# Patient Record
Sex: Female | Born: 1970 | Race: White | Hispanic: Yes | Marital: Married | Smoking: Never smoker
Health system: Southern US, Community
[De-identification: ages and names within clinical notes are randomized; demographics above are authoritative.]

## PROBLEM LIST (undated history)

## (undated) HISTORY — PX: CHOLECYSTECTOMY: SHX55

## (undated) HISTORY — PX: SHOULDER SURGERY: SHX246

---

## 2001-09-09 ENCOUNTER — Ambulatory Visit (HOSPITAL_COMMUNITY): Admission: RE | Admit: 2001-09-09 | Discharge: 2001-09-09 | Payer: Self-pay | Admitting: *Deleted

## 2002-01-09 ENCOUNTER — Inpatient Hospital Stay (HOSPITAL_COMMUNITY): Admission: AD | Admit: 2002-01-09 | Discharge: 2002-01-11 | Payer: Self-pay | Admitting: *Deleted

## 2002-04-03 ENCOUNTER — Encounter: Payer: Self-pay | Admitting: Emergency Medicine

## 2002-04-03 ENCOUNTER — Emergency Department (HOSPITAL_COMMUNITY): Admission: EM | Admit: 2002-04-03 | Discharge: 2002-04-03 | Payer: Self-pay | Admitting: Emergency Medicine

## 2003-05-15 ENCOUNTER — Ambulatory Visit (HOSPITAL_COMMUNITY): Admission: RE | Admit: 2003-05-15 | Discharge: 2003-05-15 | Payer: Self-pay | Admitting: *Deleted

## 2003-07-25 ENCOUNTER — Encounter: Admission: RE | Admit: 2003-07-25 | Discharge: 2003-07-25 | Payer: Self-pay | Admitting: *Deleted

## 2003-07-30 ENCOUNTER — Inpatient Hospital Stay (HOSPITAL_COMMUNITY): Admission: AD | Admit: 2003-07-30 | Discharge: 2003-07-31 | Payer: Self-pay | Admitting: Obstetrics and Gynecology

## 2006-04-13 ENCOUNTER — Emergency Department (HOSPITAL_COMMUNITY): Admission: EM | Admit: 2006-04-13 | Discharge: 2006-04-13 | Payer: Self-pay | Admitting: Emergency Medicine

## 2006-04-16 ENCOUNTER — Ambulatory Visit: Payer: Self-pay | Admitting: Internal Medicine

## 2007-07-04 ENCOUNTER — Emergency Department (HOSPITAL_COMMUNITY): Admission: EM | Admit: 2007-07-04 | Discharge: 2007-07-04 | Payer: Self-pay | Admitting: Emergency Medicine

## 2008-05-14 ENCOUNTER — Emergency Department (HOSPITAL_COMMUNITY): Admission: EM | Admit: 2008-05-14 | Discharge: 2008-05-14 | Payer: Self-pay | Admitting: Emergency Medicine

## 2008-08-02 ENCOUNTER — Ambulatory Visit: Payer: Self-pay | Admitting: Internal Medicine

## 2008-08-03 ENCOUNTER — Ambulatory Visit: Payer: Self-pay | Admitting: *Deleted

## 2008-10-18 ENCOUNTER — Ambulatory Visit: Payer: Self-pay | Admitting: Internal Medicine

## 2008-11-26 ENCOUNTER — Ambulatory Visit: Payer: Self-pay | Admitting: Internal Medicine

## 2008-11-29 ENCOUNTER — Ambulatory Visit (HOSPITAL_COMMUNITY): Admission: RE | Admit: 2008-11-29 | Discharge: 2008-11-29 | Payer: Self-pay | Admitting: Internal Medicine

## 2009-06-20 ENCOUNTER — Ambulatory Visit: Payer: Self-pay | Admitting: Internal Medicine

## 2010-05-16 ENCOUNTER — Emergency Department (HOSPITAL_COMMUNITY)
Admission: EM | Admit: 2010-05-16 | Discharge: 2010-05-17 | Payer: Self-pay | Source: Home / Self Care | Admitting: Emergency Medicine

## 2010-05-18 ENCOUNTER — Encounter: Payer: Self-pay | Admitting: Internal Medicine

## 2010-05-19 LAB — CBC
MCH: 30.7 pg (ref 26.0–34.0)
MCV: 86.7 fL (ref 78.0–100.0)
Platelets: 218 10*3/uL (ref 150–400)
RDW: 12.1 % (ref 11.5–15.5)
WBC: 6.4 10*3/uL (ref 4.0–10.5)

## 2010-05-19 LAB — DIFFERENTIAL
Basophils Absolute: 0 10*3/uL (ref 0.0–0.1)
Eosinophils Relative: 5 % (ref 0–5)
Lymphocytes Relative: 35 % (ref 12–46)
Lymphs Abs: 2.3 10*3/uL (ref 0.7–4.0)
Neutro Abs: 3.2 10*3/uL (ref 1.7–7.7)

## 2010-05-19 LAB — PREGNANCY, URINE: Preg Test, Ur: NEGATIVE

## 2010-05-19 LAB — URINALYSIS, ROUTINE W REFLEX MICROSCOPIC
Bilirubin Urine: NEGATIVE
Hgb urine dipstick: NEGATIVE
Ketones, ur: NEGATIVE mg/dL
Protein, ur: NEGATIVE mg/dL
Urine Glucose, Fasting: NEGATIVE mg/dL

## 2010-05-19 LAB — COMPREHENSIVE METABOLIC PANEL
AST: 21 U/L (ref 0–37)
Albumin: 3.7 g/dL (ref 3.5–5.2)
Alkaline Phosphatase: 54 U/L (ref 39–117)
BUN: 16 mg/dL (ref 6–23)
CO2: 24 mEq/L (ref 19–32)
Glucose, Bld: 107 mg/dL — ABNORMAL HIGH (ref 70–99)
Sodium: 141 mEq/L (ref 135–145)
Total Protein: 7.2 g/dL (ref 6.0–8.3)

## 2010-08-11 LAB — DIFFERENTIAL
Eosinophils Absolute: 0.3 10*3/uL (ref 0.0–0.7)
Eosinophils Relative: 4 % (ref 0–5)
Lymphocytes Relative: 28 % (ref 12–46)
Lymphs Abs: 2 10*3/uL (ref 0.7–4.0)
Monocytes Absolute: 0.6 10*3/uL (ref 0.1–1.0)
Neutrophils Relative %: 60 % (ref 43–77)

## 2010-08-11 LAB — POCT I-STAT, CHEM 8
BUN: 11 mg/dL (ref 6–23)
Chloride: 108 mEq/L (ref 96–112)
Creatinine, Ser: 0.7 mg/dL (ref 0.4–1.2)
Glucose, Bld: 80 mg/dL (ref 70–99)
Potassium: 3.8 mEq/L (ref 3.5–5.1)

## 2010-08-11 LAB — CBC
HCT: 40.4 % (ref 36.0–46.0)
Platelets: 227 10*3/uL (ref 150–400)
RDW: 12.8 % (ref 11.5–15.5)
WBC: 7.2 10*3/uL (ref 4.0–10.5)

## 2010-10-09 ENCOUNTER — Emergency Department (HOSPITAL_COMMUNITY): Payer: Medicaid Other

## 2010-10-09 ENCOUNTER — Inpatient Hospital Stay (HOSPITAL_COMMUNITY)
Admission: EM | Admit: 2010-10-09 | Discharge: 2010-10-11 | DRG: 419 | Disposition: A | Discharge status: Home / Self Care | Payer: Medicaid Other | Attending: Surgery | Admitting: Surgery

## 2010-10-09 DIAGNOSIS — J45909 Unspecified asthma, uncomplicated: Secondary | ICD-10-CM | POA: Diagnosis present

## 2010-10-09 DIAGNOSIS — K8010 Calculus of gallbladder with chronic cholecystitis without obstruction: Principal | ICD-10-CM | POA: Diagnosis present

## 2010-10-09 LAB — COMPREHENSIVE METABOLIC PANEL
Alkaline Phosphatase: 59 U/L (ref 39–117)
BUN: 16 mg/dL (ref 6–23)
CO2: 23 mEq/L (ref 19–32)
Chloride: 105 mEq/L (ref 96–112)
GFR calc Af Amer: >60 mL/min (ref >60)
Glucose, Bld: 81 mg/dL (ref 70–99)
Total Protein: 7.1 g/dL (ref 6.0–8.3)

## 2010-10-09 LAB — URINALYSIS, ROUTINE W REFLEX MICROSCOPIC
Bilirubin Urine: NEGATIVE
Glucose, UA: NEGATIVE mg/dL
Hgb urine dipstick: NEGATIVE
Ketones, ur: NEGATIVE mg/dL
Protein, ur: NEGATIVE mg/dL
pH: 6.5 (ref 5.0–8.0)

## 2010-10-09 LAB — DIFFERENTIAL
Basophils Absolute: 0 10*3/uL (ref 0.0–0.1)
Eosinophils Absolute: 0.6 10*3/uL (ref 0.0–0.7)
Monocytes Relative: 7 % (ref 3–12)
Neutro Abs: 3.8 10*3/uL (ref 1.7–7.7)
Neutrophils Relative %: 56 % (ref 43–77)

## 2010-10-09 LAB — CBC
MCV: 86.8 fL (ref 78.0–100.0)
Platelets: 209 10*3/uL (ref 150–400)
RBC: 4.61 MIL/uL (ref 3.87–5.11)
RDW: 12.2 % (ref 11.5–15.5)
WBC: 6.7 10*3/uL (ref 4.0–10.5)

## 2010-10-09 LAB — POCT PREGNANCY, URINE: Preg Test, Ur: NEGATIVE

## 2010-10-09 LAB — LIPASE, BLOOD: Lipase: 32 U/L (ref 11–59)

## 2010-10-10 ENCOUNTER — Inpatient Hospital Stay (HOSPITAL_COMMUNITY): Payer: Medicaid Other

## 2010-10-10 ENCOUNTER — Other Ambulatory Visit (INDEPENDENT_AMBULATORY_CARE_PROVIDER_SITE_OTHER): Payer: Self-pay | Admitting: Surgery

## 2010-10-11 LAB — COMPREHENSIVE METABOLIC PANEL
ALT: 26 U/L (ref 0–35)
Albumin: 3.2 g/dL — ABNORMAL LOW (ref 3.5–5.2)
Alkaline Phosphatase: 48 U/L (ref 39–117)
BUN: 4 mg/dL — ABNORMAL LOW (ref 6–23)
Potassium: 3.8 mEq/L (ref 3.5–5.1)
Sodium: 137 mEq/L (ref 135–145)
Total Protein: 6.7 g/dL (ref 6.0–8.3)

## 2010-10-11 LAB — CBC
MCHC: 33.3 g/dL (ref 30.0–36.0)
RDW: 12.5 % (ref 11.5–15.5)

## 2010-10-28 NOTE — Op Note (Signed)
  NAMEELLA, GOLOMB          ACCOUNT NO.:  [id number]  MEDICAL RECORD NO.:  [id number]  LOCATION:  1530                         FACILITY:  [facility]  PHYSICIAN:  Thornton Park. Daphine Deutscher, MD  DATE OF BIRTH:  1970/09/21  DATE OF PROCEDURE:  10/10/2010 DATE OF DISCHARGE:                              OPERATIVE REPORT   PREOPERATIVE DIAGNOSIS:  [age]-year-old Hispanic female with chronic cholecystitis, pain presented to the Emergency Room.  PROCEDURE:  Lap chole with intraoperative cholangiogram.  SURGEON:  Thornton Park. Daphine Deutscher, M.D.  ASSISTANT:  None.  ANESTHESIA:  General endotracheal.  DESCRIPTION OF PROCEDURE:  Mrs. Darius Fillingim had informed consent given to her as I brought her Spanish speaking gallbladder book from the office.  In addition, we had an interpreter, who assisted.  She was taken to room 11 and given general anesthesia.  The abdomen was prepped with PC max and draped sterilely.  I entered the abdomen through the umbilicus with Hassan technique without difficulty.  The abdomen was inflated and three trocars were placed in the upper abdomen using 5 mm up there.  The gallbladder was grasped and it was pulled up and it was between she had a cleft lateral lobe that was securing it.  I dissected out Calot's triangle, got a critical view, put a clip upon the gallbladder.  I incised the cystic duct and did a dynamic cholangiogram, which showed a fairly plump common duct, but no filling defects and prompt flow into the duodenum.  Cystic duct was then triple clipped and divided.  The cystic artery was triple clipped and divided.  There was a posterior branch, which was triple clipped and the gallbladder was removed from the gallbladder bed with hook electrocautery without entering it.  It was detached and placed in a 5 mm bag and brought out through the umbilicus and I did have to crush the stones to get it out.  I went back and looked to the gallbladder bed.  No  bleeding or bile leaks were noted.  Umbilical defect was repaired with figure-of-eight suture of 0 Vicryl under laparoscopic vision and everything looked good. The wounds were injected with Exparel and closed with 4-0 Vicryl, Benzoin Steri-Strips.  The patient tolerated the procedure well and was taken to recovery room in satisfactory addition.     Thornton Park Daphine Deutscher, MD    MBM/MEDQ  D:  10/10/2010  T:  10/10/2010  Job:  [id number]  Electronically Signed by Luretha Murphy MD on 10/28/2010 10:17:01 AM

## 2010-10-28 NOTE — H&P (Signed)
NAMETYQUISHA, SHARPS          ACCOUNT NO.:  [id number]  MEDICAL RECORD NO.:  [id number]  LOCATION:  1530                         FACILITY:  [facility]  PHYSICIAN:  Thornton Park. Daphine Deutscher, MD  DATE OF BIRTH:  [birth year]  DATE OF ADMISSION:  10/09/2010 DATE OF DISCHARGE:                             HISTORY & PHYSICAL   PRIMARY CARE PHYSICIAN:  None  ADMITTING SURGEON:  Clarrissa Shimkus B. Daphine Deutscher, M.D.  TIME OF ADMISSION:  1400 p.m.  CHIEF COMPLAINT:  Abdominal pain and asthma.  HISTORY OF PRESENT ILLNESS:  Ms. Kiehn is a very pleasant 40 year old Hispanic female with a history of asthma as well as gallstones.  The patient states that she had an asthma attack yesterday.  She has not had any asthma attack since she was a little girl.  She does not have any medications at home for her asthma, says that it has been so long she had an attack.  Right after this attack, she also began having epigastric abdominal pain.  She states this pain was very similar to the pain she had earlier in the year when she was diagnosed with gallstones. She then subsequently developed nausea and vomiting and ultimately anorexia.  Her pain persisted in the epigastric area and presented to the Emergency Department today for further evaluation.  Upon arrival today, she did have another ultrasound, which revealed multiple gallstones with no evidence of cholecystitis.  All of her labs were normal, but her pain persisted.  Therefore, we were called to evaluate the patient for surgical admission.  REVIEW OF SYSTEMS:  Please see HPI, otherwise all other systems have been reviewed and are negative.  FAMILY HISTORY:  Noncontributory.  PAST MEDICAL HISTORY: 1. Asthma. 2. Cholelithiasis.  PAST SURGICAL HISTORY:  None.  SOCIAL HISTORY:  The patient is married with four children.  She is a stay-at-home mom.  She denies any alcohol, tobacco or illicit drug abuse.  ALLERGIES:  NKDA.  MEDICATIONS AT HOME:   None.  PHYSICAL EXAMINATION:  GENERAL:  Ms. Rister is a very pleasant 40- year-old slightly overweight Hispanic female, who is currently lying in bed, mildly tearful, but in no obvious distress. VITAL SIGNS:  Temperature 97.6, pulse 78, respirations 20, blood pressure 126/74. HEENT:  Head is normocephalic, atraumatic.  Sclerae noninjected.  Pupils are equal, round and reactive to light.  Ears and nose without any obvious masses or lesions.  No rhinorrhea.  Nares are pink.  Throat shows no exudate. HEART:  Regular rate and rhythm.  Normal S1, S2.  No murmurs, gallops or rubs are noted.  She does have palpable carotid, radial and pedal pulses bilaterally. LUNGS:  Reveals diffuse bilateral wheezing, but no rhonchi or rales are noted.  Respiratory effort is nonlabored. ABDOMEN:  Soft.  Mild epigastric tenderness, but otherwise nontender. She is nondistended with hypoactive bowel sounds.  No masses, hernias or organomegaly are noted. SKIN:  Warm and dry with no mass, lesions or rashes. PSYCHIATRIC:  The patient is alert and oriented x3 with an appropriate affect.  LABORATORY DATA:  White blood cell count 6700, hemoglobin 13.8, hematocrit 40, platelet count is 209,000.  Sodium 138, potassium 3.7, glucose 81, BUN 16, creatinine 0.53, lipase is 32.  LFTs are all unremarkable.  DIAGNOSTIC STUDIES:  Ultrasound of the abdomen and pelvis revealed multiple gallstones with no evidence of cholecystitis.  An acute abdominal series reveals no acute cardiopulmonary process.  IMPRESSION: 1. Symptomatic cholelithiasis. 2. Asthma.  PLAN:  At this time, we will get the patient admitted for symptomatic treatment.  We will plan for surgical intervention tomorrow for a cholecystectomy.  I have spoken to the patient, who only speaks Spanish via Nurse, learning disability.  I have explained the procedure including laparoscopic versus open along with risks and complications to her in Spanish.  She understands and  wishes to proceed.  In the meantime, we will start on p.r.n. medications as needed for pain and nausea as well as IV fluids. She will receive a dose of Unasyn 3 grams IV on-call to the operating room tomorrow morning.  We will also start her on p.r.n. nebulizers as well as an albuterol inhaler as needed for her asthma and shortness of breath.     Letha Cape, PA   ______________________________ Thornton Park Daphine Deutscher, MD    KEO/MEDQ  D:  10/09/2010  T:  10/09/2010  Job:  [id number]  Electronically Signed by Barnetta Chapel PA on 10/16/2010 01:24:57 PM Electronically Signed by Luretha Murphy MD on 10/28/2010 10:16:58 AM

## 2010-10-31 ENCOUNTER — Ambulatory Visit (INDEPENDENT_AMBULATORY_CARE_PROVIDER_SITE_OTHER): Payer: Self-pay | Admitting: Surgery

## 2010-10-31 VITALS — BP 112/68 | HR 64 | Temp 97.6°F

## 2010-10-31 DIAGNOSIS — Z09 Encounter for follow-up examination after completed treatment for conditions other than malignant neoplasm: Secondary | ICD-10-CM

## 2010-10-31 NOTE — Progress Notes (Signed)
With the aid of an interpreter I interviewed Ms. Bellow Reyes. She is 3 weeks out from a laparoscopic cholecystectomy. Hurst sutures and incisions are healed nicely with Dermabond in place. She has been eating well.  Yesterday she had a fever and some chills. She denies dysuria nausea or vomiting. She had a mild headache which is gone today.  She looks well and from the standpoint of her surgery she seems to be getting along very well. She has no rashes, no headaches, and she may have had a viral syndrome. I told her that if it persisted she should see emergency room for workup. Otherwise she has done well after laparoscopic cholecystectomy and we will be glad to see her when needed.

## 2010-11-13 NOTE — Discharge Summary (Signed)
  NAMEHARLYM, Sydney Lopez          ACCOUNT NO.:  [id number]  MEDICAL RECORD NO.:  [id number]  LOCATION:  1530                         FACILITY:  [facility]  PHYSICIAN:  Thornton Park. Daphine Deutscher, MD  DATE OF BIRTH:  02/10/1971  DATE OF ADMISSION:  10/09/2010 DATE OF DISCHARGE:  10/11/2010                              DISCHARGE SUMMARY   ADMITTING PHYSICIAN:  Dr. Luretha Murphy  DISCHARGING PHYSICIAN:  Dr. Luretha Murphy.  CONSULTANTS:  None.  PROCEDURES:  Laparoscopic cholecystectomy with intraoperative cholangiogram by Dr. Daphine Deutscher on October 10, 2010.  REASON FOR ADMISSION:  Ms. Ordaz is a 40 year old Hispanic female who has a history of asthma as well as gallstones.  The patient had an asthma attack the day prior to admission.  She then developed epigastric abdominal pain.  She developed nausea and vomiting.  When she presented to the emergency department, she was diagnosed with gallstones.  Please see admitting history and physical for further details.  ADMITTING DIAGNOSES: 1. Symptomatic cholelithiasis. 2. Asthma.  HOSPITAL COURSE:  This time the patient was admitted.  She was taken to the operating room where she underwent a laparoscopic cholecystectomy. She tolerated this procedure well and on postoperative day #1 the patient was doing well with minimal pain.  She was tolerating a regular diet.  Her abdomen was soft, appropriately tender with active bowel sounds.  Her incisions were clean, dry and intact.  She was felt stable for discharge home.  DISCHARGE DIAGNOSES: 1. Symptomatic cholelithiasis. 2. Status post lap chole. 3. Asthma.  DISCHARGE MEDICATIONS:  Please see medication reconciliation form.  DISCHARGE INSTRUCTIONS:  The patient needs to return to Dr. Daphine Deutscher in 3 weeks.  She has no activity restrictions.  No diet restrictions, and she may shower.     Letha Cape, PA   ______________________________ Thornton Park Daphine Deutscher, MD    KEO/MEDQ  D:   10/23/2010  T:  10/23/2010  Job:  [id number]  Electronically Signed by Barnetta Chapel PA on 10/31/2010 04:19:02 PM Electronically Signed by Luretha Murphy MD on 11/13/2010 08:46:13 AM

## 2010-11-27 ENCOUNTER — Inpatient Hospital Stay (HOSPITAL_COMMUNITY)
Admission: EM | Admit: 2010-11-27 | Discharge: 2010-11-30 | DRG: 203 | Disposition: A | Discharge status: Home / Self Care | Payer: Self-pay | Attending: Internal Medicine | Admitting: Internal Medicine

## 2010-11-27 ENCOUNTER — Emergency Department (HOSPITAL_COMMUNITY): Payer: Self-pay

## 2010-11-27 DIAGNOSIS — M25519 Pain in unspecified shoulder: Secondary | ICD-10-CM | POA: Diagnosis present

## 2010-11-27 DIAGNOSIS — IMO0001 Reserved for inherently not codable concepts without codable children: Secondary | ICD-10-CM | POA: Diagnosis present

## 2010-11-27 DIAGNOSIS — J45901 Unspecified asthma with (acute) exacerbation: Principal | ICD-10-CM | POA: Diagnosis present

## 2010-11-27 DIAGNOSIS — R7309 Other abnormal glucose: Secondary | ICD-10-CM | POA: Diagnosis present

## 2010-11-27 LAB — POCT I-STAT, CHEM 8
BUN: 17 mg/dL (ref 6–23)
Chloride: 107 mEq/L (ref 96–112)
Creatinine, Ser: 0.6 mg/dL (ref 0.50–1.10)
Sodium: 140 mEq/L (ref 135–145)
TCO2: 22 mmol/L (ref 0–100)

## 2010-11-28 LAB — BASIC METABOLIC PANEL
BUN: 11 mg/dL (ref 6–23)
CO2: 22 mEq/L (ref 19–32)
Calcium: 9.5 mg/dL (ref 8.4–10.5)
Creatinine, Ser: 0.61 mg/dL (ref 0.50–1.10)
Glucose, Bld: 158 mg/dL — ABNORMAL HIGH (ref 70–99)

## 2010-11-28 LAB — CBC
HCT: 39.9 % (ref 36.0–46.0)
MCH: 29.8 pg (ref 26.0–34.0)
MCV: 87.5 fL (ref 78.0–100.0)
Platelets: 230 10*3/uL (ref 150–400)
RBC: 4.56 MIL/uL (ref 3.87–5.11)

## 2010-11-28 NOTE — H&P (Signed)
Sydney Lopez, Sydney Lopez          ACCOUNT NO.:  [id number]  MEDICAL RECORD NO.:  [id number]  LOCATION:  WLED                         FACILITY:  [facility]  PHYSICIAN:  Gery Pray, MD      DATE OF BIRTH:  [birth year]  DATE OF ADMISSION:  11/27/2010 DATE OF DISCHARGE:                             HISTORY & PHYSICAL   PRIMARY CARE PHYSICIAN:  None.  CODE STATUS:  Full code.  The patient is going to team 2.  CHIEF COMPLAINT:  Shortness of breath.  HISTORY OF PRESENT ILLNESS:  This is a pleasant 40 year old female with known history of asthma.  She states that, for the past 2 days, she has been having increasing shortness of breath and wheezing.  She has been having a cough that is productive of whitish phlegm.  She has developed chest pain, pleuritic in nature, worse with the cough.  She reports no fevers, no chills, no nausea and vomiting.  She does not smoke.  She has no sick contacts.  The last time she had asthma exacerbation was over a year ago.  She has never been intubated.  She states that she has been using her MDI and it has not been helping.  Her shortness of breath became severe enough she came to the ER.  She received 1-hour  long nebulizer treatment of Solu-Medrol.  She has improved; however, she still has significant wheezing and is quite short of breath.  A hospital service was called, which request admission.  Of note, the patient is primarily Spanish-speaking.  The history was obtained from the patient through a translator.  PAST MEDICAL HISTORY:  Negative.  PAST SURGICAL HISTORY:  The patient states she had a surgery 2 months ago.  She does not know what they took out.  MEDICATIONS:  MDI.  ALLERGIES:  None.  SOCIAL HISTORY:  Negative tobacco, alcohol, illicit drugs.  FAMILY HISTORY:  Negative for diabetes mellitus or hypertension.  REVIEW OF SYSTEMS:  All 10-point systems reviewed and negative except as noted in the HPI.  PHYSICAL EXAMINATION:  VITAL  SIGNS:  Blood pressure 136/92, pulse 106, respirations 22, temperature 98.2, saturating 95% on room air. GENERAL:  Alert, oriented female; in no acute distress. HEENT:  Eyes:  Pink conjunctivae.  PERRLA.   ENT:  Moist oral mucosa. Trachea midline. NECK:  Supple. no thyromegaly LUNGS:  Wheezy throughout.  No use of accessory muscles. ABDOMEN:  Soft.  Positive bowel sounds.  Nontender, nondistended.  No organomegaly. NEUROLOGIC:  Cranial nerves II-XII grossly intact.  Sensation intact. Musculoskeletal: 5/5 in all extremities.  No clubbing, cyanosis or edema. SKIN:  No rashes.  No subcutaneous crepitations.  LABORATORY DATA:  Sodium 140, potassium 4.3, chloride 107, BUN 17, creatinine 0.6, calcium 1.16.  Hemoglobin 13.5.  No white blood cells available.  These labs were done through the I-STAT.  Chest x-ray shows no acute cardiopulmonary disease.  EKG normal sinus rhythm.  ASSESSMENT AND PLAN:  Acute exacerbation of asthma.  The patient will be admitted.  We will go ahead and order Solu-Medrol and nebulizer treatment, oxygen as needed to keep saturations greater than 90%.  We will reevaluate her in the a.m.  ______________________________ Gery Pray, MD     DC/MEDQ  D:  11/27/2010  T:  11/28/2010  Job:  [id number]  Electronically Signed by Gery Pray MD on 11/28/2010 02:27:55 AM

## 2010-12-02 NOTE — Discharge Summary (Signed)
Sydney Lopez, Sydney Lopez          ACCOUNT NO.:  [id number]  MEDICAL RECORD NO.:  [id number]  LOCATION:  1315                         FACILITY:  [facility]  PHYSICIAN:  Hillery Aldo, M.D.   DATE OF BIRTH:  1971/03/26  DATE OF ADMISSION:  11/27/2010 DATE OF DISCHARGE:  11/30/2010                              DISCHARGE SUMMARY   PRIMARY CARE PHYSICIAN:  Dineen Kid. Reche Dixon, MD, at [facility].  DISCHARGE DIAGNOSES: 1. Acute asthma exacerbation. 2. Acute respiratory failure. 3. Myalgias. 4. Steroid-induced hyperglycemia.  DISCHARGE MEDICATIONS: 1. Robitussin DM 10 cc p.o. q.4 h. p.r.n. cough. 2. Prednisone taper 60 mg on December 01, 2010, tapered off over the next     6 days. 3. Tylenol 500 mg 2 tablets p.o. q.6 h. p.r.n. headache or pain. 4. Albuterol inhaler 1 puff q.6 h. p.r.n. dyspnea.  CONSULTATIONS:  None.  BRIEF ADMISSION HISTORY OF PRESENT ILLNESS:  The patient is a 40 year old female with past medical history of asthma who presented to the hospital with a 2-day history of increasing dyspnea and wheezing.  She also reported cough, productive of white phlegm.  She had been using her albuterol HFA and it had not been successful in alleviating her symptoms, so she presented to the emergency department where she was found to be in acute bronchospasm.  When she failed to recover significantly after 1 hour long nebulizer treatment and treatment with Solu-Medrol, she was referred to the hospitalist service for further evaluation and treatment.  For the full details, please see the dictated report done by Dr. Joneen Roach.  PROCEDURES AND DIAGNOSTIC STUDIES:  Chest x-ray on November 27, 2010, showed no active cardiopulmonary disease.  DISCHARGE LABORATORY VALUES:  Sodium was 137, potassium 3.6, chloride 105, bicarbonate 22, BUN 11, creatinine 0.61, glucose 158, calcium 9.5. White blood cell count was 6.9, hemoglobin 13.6, hematocrit 39.9, platelets 230.  HOSPITAL COURSE BY PROBLEM: 1.  Acute respiratory failure secondary to asthma exacerbation:  The     patient was admitted and put on high-dose IV steroids as well as     empiric treatment with azithromycin for possible bronchitis.  Her     cough was treated symptomatically and she was placed on nebulized     bronchodilator therapy.  Over the course of her hospital stay, her     symptoms gradually improved.  She is still having some wheezing,     but feels much better and is stable for discharge home.  We will     discharge her on a prednisone taper and p.r.n. bronchodilator     therapy as well as over-the-counter antitussives.  At this point,     she is maintaining her oxygen saturations reasonably well.  She is     encouraged to follow up at [facility] in 1-2 weeks. 2. Myalgias:  Symptomatic treatment was provided. 3. Steroid-induced hyperglycemia:  Expected to resolve once her     steroids are tapered off.  DISPOSITION:  The patient is medically stable and will be discharged home.  DISCHARGE INSTRUCTIONS:  Activity:  Increased activity slowly. Diet:  As tolerated, avoid milk products for 1 week. Followup:  With Dr. Daphine Deutscher or Reche Dixon at [facility] in 1-2 weeks.  Call for an  appointment.  Time spent coordinating care for discharge, discharge instructions including face-to-face time equals approximately 25 minutes.     Hillery Aldo, M.D.     CR/MEDQ  D:  11/30/2010  T:  11/30/2010  Job:  [id number]  cc:   [facility] Fax: [phone]  Electronically Signed by Hillery Aldo M.D. on 12/02/2010 07:12:30 AM

## 2011-08-27 ENCOUNTER — Other Ambulatory Visit: Payer: Self-pay | Admitting: Obstetrics and Gynecology

## 2011-08-27 DIAGNOSIS — Z1231 Encounter for screening mammogram for malignant neoplasm of breast: Secondary | ICD-10-CM

## 2011-09-01 ENCOUNTER — Ambulatory Visit (INDEPENDENT_AMBULATORY_CARE_PROVIDER_SITE_OTHER): Payer: Self-pay | Admitting: *Deleted

## 2011-09-01 ENCOUNTER — Ambulatory Visit (HOSPITAL_COMMUNITY)
Admission: RE | Admit: 2011-09-01 | Discharge: 2011-09-01 | Disposition: A | Discharge status: Home / Self Care | Payer: Self-pay | Source: Ambulatory Visit | Attending: Obstetrics and Gynecology | Admitting: Obstetrics and Gynecology

## 2011-09-01 VITALS — BP 107/74 | HR 75 | Temp 97.9°F | Ht 59.0 in | Wt 169.4 lb

## 2011-09-01 DIAGNOSIS — Z1231 Encounter for screening mammogram for malignant neoplasm of breast: Secondary | ICD-10-CM

## 2011-09-01 DIAGNOSIS — Z1239 Encounter for other screening for malignant neoplasm of breast: Secondary | ICD-10-CM

## 2011-09-01 NOTE — Progress Notes (Signed)
No complaints today.  Pap Smear:    Pap smear not performed today. Patients last Pap smear was 04/15/11 at the [facility] and was normal. Per patient she has no history of abnormal Pap smears. No Pap smear results in EPIC.  Physical exam: Breasts Breasts symmetrical. No skin abnormalities bilateral breasts. No nipple retraction bilateral breasts. No nipple discharge bilateral breasts. No lymphadenopathy. No lumps palpated bilateral breasts. No complaints of pain or tenderness on exam.         Pelvic/Bimanual No Pap smear completed today since last Pap smear was 04/15/11 and normal. Pap smear not indicated per BCCCP guidelines.

## 2011-09-01 NOTE — Patient Instructions (Signed)
Taught patient how to perform BSE and gave educational materials to take home. Patient did not need a Pap smear today due to last Pap smear was 04/15/11. Let her know BCCCP will cover Pap smears every 3 years unless has a history of abnormal Pap smears. Patient is escorted to mammography for a screening mammogram. Let patient know will follow up with her within the next couple weeks with results. Patient verbalized understanding.

## 2011-09-09 ENCOUNTER — Encounter: Payer: Self-pay | Admitting: Obstetrics and Gynecology

## 2013-11-28 ENCOUNTER — Other Ambulatory Visit: Payer: Self-pay | Admitting: Nurse Practitioner

## 2013-11-28 DIAGNOSIS — Z1231 Encounter for screening mammogram for malignant neoplasm of breast: Secondary | ICD-10-CM

## 2014-07-23 ENCOUNTER — Ambulatory Visit (INDEPENDENT_AMBULATORY_CARE_PROVIDER_SITE_OTHER): Payer: Self-pay | Admitting: Internal Medicine

## 2014-07-23 VITALS — BP 132/82 | HR 78 | Temp 98.3°F | Resp 16

## 2014-07-23 DIAGNOSIS — R062 Wheezing: Secondary | ICD-10-CM

## 2014-07-23 MED ORDER — PREDNISONE 20 MG PO TABS: ORAL_TABLET | ORAL | Status: DC

## 2014-07-23 MED ORDER — HYDROCODONE-HOMATROPINE 5-1.5 MG/5ML PO SYRP: 5.0000 mL | ORAL_SOLUTION | Freq: Four times a day (QID) | ORAL | Status: DC | PRN

## 2014-07-23 MED ORDER — ALBUTEROL SULFATE HFA 108 (90 BASE) MCG/ACT IN AERS: 2.0000 | INHALATION_SPRAY | Freq: Four times a day (QID) | RESPIRATORY_TRACT | Status: DC | PRN

## 2014-07-23 MED ORDER — AZITHROMYCIN 250 MG PO TABS: ORAL_TABLET | ORAL | Status: DC

## 2014-07-23 MED ORDER — METHYLPREDNISOLONE ACETATE 80 MG/ML IJ SUSP: 80.0000 mg | Freq: Once | INTRAMUSCULAR | Status: DC

## 2014-07-24 NOTE — Progress Notes (Signed)
First urgent medical and family care visit Chief Complaint  Patient presents with  . Shortness of Breath  . Cough  . Emesis  . Headache    She has had progressive trouble over the last 5 days with wheezing that causes her to be short of breath and then to cough to the point of throwing up. She has had an intermittent headache with the coughing spells. There is no underlying nausea. She has no fever. She has a history of asthma and uses albuterol occasionally but is never had to be on continuous medication. She was hospitalized or treated in the emergency room about a year ago for similar problem that was more severe. Her albuterol inhaler has now run out.   She has no other underlying medical problems and is on no medications   Exam BP 132/82 mmHg  Pulse 78  Temp(Src) 98.3 F (36.8 C) (Oral)  Resp 16  SpO2 97%  LMP 06/20/2014  Conjunctiva slightly injected but PERRLA and EOMs conjugate  TMs clear/ nares clear  Throat clear  No cervical adenopathy  Lungs clear except wheezing on forced expiration bilaterally  Abdomen supple  Heart regular without murmur  Extremities with no edema  Skin shows no obvious rash  She is oriented to time person and place and speaks English fairly well for us to get through this visit with the use of cell phone translation at certain times   Impression  Asthma exacerbation most probably by lower respiratory infection  Because of her experience with getting so much worse last year she asked to start treatment with an injection of steroids as a used in the hospital. Meds ordered this encounter  Medications  . methylPREDNISolone acetate (DEPO-MEDROL) injection 80 mg    Sig:   . azithromycin (ZITHROMAX) 250 MG tablet    Sig: As packaged    Dispense:  6 tablet    Refill:  0  . HYDROcodone-homatropine (HYCODAN) 5-1.5 MG/5ML syrup    Sig: Take 5 mLs by mouth every 6 (six) hours as needed. For cough    Dispense:  120 mL    Refill:  0  . predniSONE  (DELTASONE) 20 MG tablet    Sig: 3/3/2/2/1/1 single daily dose for 6 days starting Tuesday morning    Dispense:  12 tablet    Refill:  0  . albuterol (PROVENTIL HFA;VENTOLIN HFA) 108 (90 BASE) MCG/ACT inhaler    Sig: Inhale 2 puffs into the lungs every 6 (six) hours as needed for wheezing or shortness of breath.    Dispense:  1 Inhaler    Refill:  5    follow-up in 2-48 hours if not responding

## 2015-01-10 ENCOUNTER — Emergency Department (HOSPITAL_COMMUNITY): Payer: Self-pay

## 2015-01-10 ENCOUNTER — Emergency Department (HOSPITAL_COMMUNITY)
Admission: EM | Admit: 2015-01-10 | Discharge: 2015-01-10 | Disposition: A | Discharge status: Home / Self Care | Payer: Self-pay | Attending: Emergency Medicine | Admitting: Emergency Medicine

## 2015-01-10 DIAGNOSIS — Z79899 Other long term (current) drug therapy: Secondary | ICD-10-CM | POA: Insufficient documentation

## 2015-01-10 DIAGNOSIS — G44209 Tension-type headache, unspecified, not intractable: Secondary | ICD-10-CM

## 2015-01-10 DIAGNOSIS — J45909 Unspecified asthma, uncomplicated: Secondary | ICD-10-CM | POA: Insufficient documentation

## 2015-01-10 DIAGNOSIS — R079 Chest pain, unspecified: Secondary | ICD-10-CM

## 2015-01-10 LAB — CBC
HCT: 37.5 % (ref 36.0–46.0)
Hemoglobin: 13.2 g/dL (ref 12.0–15.0)
MCH: 30.4 pg (ref 26.0–34.0)
MCHC: 35.2 g/dL (ref 30.0–36.0)
MCV: 86.4 fL (ref 78.0–100.0)
PLATELETS: 280 10*3/uL (ref 150–400)
RBC: 4.34 MIL/uL (ref 3.87–5.11)
RDW: 12.2 % (ref 11.5–15.5)
WBC: 12.8 10*3/uL — ABNORMAL HIGH (ref 4.0–10.5)

## 2015-01-10 LAB — BASIC METABOLIC PANEL
Anion gap: 10 (ref 5–15)
BUN: 12 mg/dL (ref 6–20)
CALCIUM: 8.9 mg/dL (ref 8.9–10.3)
CHLORIDE: 106 mmol/L (ref 101–111)
CO2: 20 mmol/L — AB (ref 22–32)
CREATININE: 0.68 mg/dL (ref 0.44–1.00)
GFR calc Af Amer: >60 mL/min (ref >60)
GFR calc non Af Amer: >60 mL/min (ref >60)
Glucose, Bld: 106 mg/dL — ABNORMAL HIGH (ref 65–99)
Potassium: 3.6 mmol/L (ref 3.5–5.1)
Sodium: 136 mmol/L (ref 135–145)

## 2015-01-10 LAB — MAGNESIUM: Magnesium: 2 mg/dL (ref 1.7–2.4)

## 2015-01-10 LAB — I-STAT TROPONIN, ED
TROPONIN I, POC: 0 ng/mL (ref 0.00–0.08)
TROPONIN I, POC: 0 ng/mL (ref 0.00–0.08)

## 2015-01-10 LAB — HCG, SERUM, QUALITATIVE: PREG SERUM: NEGATIVE

## 2015-01-10 MED ORDER — SODIUM CHLORIDE 0.9 % IV BOLUS (SEPSIS)
1000.0000 mL | Freq: Once | INTRAVENOUS | Status: AC
  Administered 2015-01-10: 1000 mL via INTRAVENOUS

## 2015-01-10 MED ORDER — METOCLOPRAMIDE HCL 5 MG/ML IJ SOLN
10.0000 mg | Freq: Once | INTRAMUSCULAR | Status: AC
  Administered 2015-01-10: 10 mg via INTRAVENOUS
  Filled 2015-01-10: qty 2

## 2015-01-10 MED ORDER — KETOROLAC TROMETHAMINE 30 MG/ML IJ SOLN
30.0000 mg | Freq: Once | INTRAMUSCULAR | Status: AC
  Administered 2015-01-10: 30 mg via INTRAVENOUS
  Filled 2015-01-10: qty 1

## 2015-01-10 NOTE — ED Notes (Signed)
Pt in from [facility] via [facility], per report pt had recent R shoulder sx, pt rcvd Cortisone inj yesterday, hx of the same inj with no reaction, pt seen at orthopedic office today radiating CP to L arm onset post inj yesterday, pt c/o SOB, denies n/v/d, pt rcvd 324 mg ASA, pt rcvd x 1 SL nitro with unchanged pain relief

## 2015-01-10 NOTE — ED Notes (Signed)
Patient transported to X-ray 

## 2015-01-10 NOTE — ED Notes (Signed)
Used the translator service to explain discharge instructions.  Answered all questions pt had.

## 2015-01-10 NOTE — ED Provider Notes (Signed)
CSN: [id number]     Arrival date & time 01/10/15  1601 History   First MD Initiated Contact with Patient 01/10/15 1609     Chief Complaint  Patient presents with  . Chest Pain     (Consider location/radiation/quality/duration/timing/severity/associated sxs/prior Treatment) HPI  History obtained via spanish interpretor  44 year old female who presents with headache and intermittent chest pain. History of asthma. Was in her usual state of health yesterday and received a cortisone shot into her right shoulder at [facility] orthopedics. On the way home, describes that she had pins and needles over the left side of her chest associated with achiness in the left arm. Stated that this was off and on, but with ambulation symptoms improved and went away. Denies reproducibility of symptoms. Throughout yesterday and today also developed intermittent headaches behind both eyes associated with nausea. Has not had headaches in the past. Denies sudden onset maximal intensity of headache. Given persistent symptoms, she called EMS. Was given a full dose of aspirin as well as all as sublingual nitroglycerin. Patient reports worsening of her headache after sublingual nitroglycerin. Denies numbness, weakness, double vision, speech changes, ataxia, syncope, pleuritic nature of pain, lower extremity swelling or pain. Past Medical History  Diagnosis Date  . Asthma    Past Surgical History  Procedure Laterality Date  . Cholecystectomy     Family History  Problem Relation Age of Onset  . Diabetes Brother    Social History  Substance Use Topics  . Smoking status: Never Smoker   . Smokeless tobacco: Never Used  . Alcohol Use: No   OB History    Gravida Para Term Preterm AB TAB SAB Ectopic Multiple Living   5 4 4  1  1   4      Review of Systems 10/14 systems reviewed and are negative other than those stated in the HPI    Allergies  Review of patient's allergies indicates no known allergies.  Home  Medications   Prior to Admission medications   Medication Sig Start Date End Date Taking? Authorizing Provider  albuterol (PROVENTIL HFA;VENTOLIN HFA) 108 (90 BASE) MCG/ACT inhaler Inhale 2 puffs into the lungs every 6 (six) hours as needed for wheezing or shortness of breath. 07/23/14  Yes [name], MD  ibuprofen (ADVIL,MOTRIN) 200 MG tablet Take 200 mg by mouth every 6 (six) hours as needed for moderate pain.    Yes Historical Provider, MD  azithromycin (ZITHROMAX) 250 MG tablet As packaged Patient not taking: Reported on 01/10/2015 07/23/14   [name], MD  HYDROcodone-homatropine [facility]) 5-1.5 MG/5ML syrup Take 5 mLs by mouth every 6 (six) hours as needed. For cough Patient not taking: Reported on 01/10/2015 07/23/14   [name], MD  predniSONE (DELTASONE) 20 MG tablet 3/3/2/2/1/1 single daily dose for 6 days starting Tuesday morning Patient not taking: Reported on 01/10/2015 07/23/14   [name], MD   BP 102/66 mmHg  Pulse 63  Temp(Src) 98.1 F (36.7 C) (Oral)  Resp 16  SpO2 100% Physical Exam Physical Exam  Nursing note and vitals reviewed. Constitutional: Well developed, well nourished, non-toxic, and in no acute distress Head: Normocephalic and atraumatic.  Mouth/Throat: Oropharynx is clear and moist.  Neck: Normal range of motion. Neck supple.  Cardiovascular: Normal rate and regular rhythm. No chest wall tenderness. No lower extremity edema.    Pulmonary/Chest: Effort normal and breath sounds normal.  Abdominal: Soft. There is no tenderness. There is no rebound and no guarding.  Musculoskeletal:  Normal range of motion.  Neurological: Alert, no facial droop, fluent speech, sensation to light touch in tact throughout, EOMI, PERRL, palpate moves symmetrically, normal SCM/trap strength, no pronator drift, no drift in lower extremities to gravity, no dysmetria Skin: Skin is warm and dry.  Psychiatric: Cooperative  ED Course  Procedures  (including critical care time) Labs Review Labs Reviewed  BASIC METABOLIC PANEL - Abnormal; Notable for the following:    CO2 20 (*)    Glucose, Bld 106 (*)    All other components within normal limits  CBC - Abnormal; Notable for the following:    WBC 12.8 (*)    All other components within normal limits  MAGNESIUM  HCG, SERUM, QUALITATIVE  I-STAT TROPOININ, ED  Rosezena Sensor, ED    Imaging Review Dg Chest 2 View  01/10/2015   CLINICAL DATA:  Headache with intermittent chest pain. History of asthma.  EXAM: CHEST  2 VIEW  COMPARISON:  11/27/2010.  FINDINGS: Low lung volumes. Normal cardiomediastinal silhouette. No active infiltrates or failure. No effusion or pneumothorax. Bones unremarkable. Prior film showed deeper inspiration but was otherwise unchanged.  IMPRESSION: No active cardiopulmonary disease.   Electronically Signed   By: Elsie Stain M.D.   On: 01/10/2015 17:30   I have personally reviewed and evaluated these images and lab results as part of my medical decision-making.   EKG Interpretation   Date/Time:  Thursday January 10 2015 19:51:23 EDT Ventricular Rate:  64 PR Interval:  143 QRS Duration: 83 QT Interval:  436 QTC Calculation: 450 R Axis:   59 Text Interpretation:  Sinus rhythm No significant change since last  tracing ED PHYSICIAN INTERPRETATION AVAILABLE IN [facility]  Reconfirmed by Dantavious Snowball MD, Karly Pitter [phone]) on 01/11/2015 2:41:08 PM      MDM   Final diagnoses:  Tension-type headache, not intractable, unspecified chronicity pattern  Chest pain, unspecified chest pain type    44 year old who presents with headache and intermittent chest discomfort after steroid injection into the right shoulder. Not ill appearing on arrival and VS non-concerning. She is neuro in tact. Cardiopulmonary exam unremarkable. Low suspicion for serious neurological or cardiopulmonary processes at this time.  Headache presentation acutely worsened with nitroglycerin, but  quality prior not of sudden onset maximal intensity. Not concerning for [facility]. Presentation not suggestive of acute infectious processes. No major risk factors for thromboembolic disease to warrant consideration for venous sinus thrombosis. Given headache cocktail with near resolution of symptoms. Has been taking around the clock ibuprofen for right shoulder pain, and may be 2/2 to rebound headache from frequent analgesic use.   In regards to her chest pain. HEart score 1 for obesity, and symptoms not suggestive of anginal equivalent given improved with exertion. EKG non-ischemic and serial troponin negative. No dynamic EKG changes noted either. FElt adequately ruled out for ACS. CXR negative and without acute cardiopulmonary processes. PERC negative and ruled out for PE.   Given symptoms improved and low suspicion for serious etiology, felt appropriate for discharge home. Given resources for outpatient follow-up. Strict return and follow-up instructions reviewed. She expressed understanding of all discharge instructions and felt comfortable with the plan of care.     Lavera Guise, MD 01/11/15 [phone]

## 2015-01-10 NOTE — Discharge Instructions (Signed)
Dolor de pecho (no específico) °(Chest Pain (Nonspecific)) °Con frecuencia es difícil dar un diagnóstico específico de la causa del dolor de pecho. Siempre hay una posibilidad de que el dolor podría estar relacionado con algo grave, como un ataque al corazón o un coágulo sanguíneo en los pulmones. Debe someterse a controles con el médico para más evaluaciones. °CAUSAS  °· Acidez. °· Neumonía o bronquitis. °· Ansiedad o estrés. °· Inflamación de la zona que rodea al corazón (pericarditis) o a los pulmones (pleuritis o pleuresía). °· Un coágulo sanguíneo en el pulmón. °· Colapso de un pulmón (neumotórax), que puede aparecer de manera repentina por sí solo (neumotórax espontáneo) o debido a un traumatismo en el tórax. °· Culebrilla (virus del herpes zóster). °La pared torácica está compuesta por huesos, músculos y cartílago. Cualquiera de estos puede ser la fuente del dolor. °· Puede haber una contusión en los huesos debido a una lesión. °· Puede haber un esguince en los músculos o el cartílago ocasionado por la tos o por trabajo excesivo. °· El cartílago puede verse afectado por una inflamación y provocar dolor (costocondritis). °DIAGNÓSTICO  °Quizás se necesiten análisis de laboratorio u otros estudios para encontrar la causa del dolor. Además, puede indicarle que se haga una prueba llamada electrocadiograma (ECG) ambulatorio. El ECG registra los patrones de los latidos cardíacos durante 24 horas. Además, pueden hacerle otros estudios, por ejemplo: °· Ecocardiograma transtorácico (ETT). Durante el ecocardiograma, se usan ondas sonoras para evaluar el flujo de la sangre a través del corazón. °· Ecocardiograma transesofágico (ETE). °· Monitoreo cardíaco. Permite que el médico controle la frecuencia y el ritmo cardíaco en tiempo real. °· Monitor Holter. Es un dispositivo portátil que registra los latidos cardíacos y ayuda a diagnosticar las arritmias cardíacas. Le permite al médico registrar la actividad cardíaca  durante varios días, si es necesario. °· Pruebas de estrés por ejercicio o por medicamentos que aceleran los latidos cardíacos. °TRATAMIENTO  °· El tratamiento depende de la causa del dolor de pecho. El tratamiento puede incluir: °¨ Inhibidores de la acidez estomacal. °¨ Antiinflamatorios. °¨ Analgésicos para las enfermedades inflamatorias. °¨ Antibióticos, si hay una infección. °· Podrán aconsejarle que modifique su estilo de vida. Esto incluye dejar de fumar y evitar el alcohol, la cafeína y el chocolate. °· Pueden aconsejarle que mantenga la cabeza levantada (elevada) cuando duerme. Esto reduce la probabilidad de que el ácido retroceda del estómago al esófago. °En la mayoría de los casos, el dolor de pecho no específico mejorará en el término de 2 a 3 días, con reposo y analgésicos suaves.  °INSTRUCCIONES PARA EL CUIDADO EN EL HOGAR  °· Si le prescriben antibióticos, tómelos tal como se le indicó. Termínelos aunque comience a sentirse mejor. °· Durante los días siguientes, no haga actividades físicas que provoquen dolor de pecho. Continúe con las actividades físicas tal como se le indicó °· No consuma ningún producto que contenga tabaco, incluidos cigarrillos, tabaco de mascar o cigarrillos electrónicos. °· Evite el consumo de alcohol. °· Tome los medicamentos solamente como se lo haya indicado el médico. °· Siga las sugerencias del médico en lo que respecta a las pruebas adicionales, si el dolor de pecho no desaparece. °· Concurra a todas las visitas de control programadas. Si no lo hace, podría desarrollar problemas permanentes (crónicos) relacionados con el dolor. Si hay algún problema para concurrir a una cita, llame para reprogramarla. °SOLICITE ATENCIÓN MÉDICA SI:  °· El dolor de pecho no desaparece, incluso después del tratamiento. °· Tiene una erupción cutánea con ampollas en el   pecho.  Lance Muss. SOLICITE ATENCIN MDICA DE [employer] SI:   Aumenta el dolor de pecho o este se irradia hacia el  brazo, el cuello, la [place], la espalda o el abdomen.  Le falta el aire.  La tos empeora, o expectora sangre.  Siente dolor intenso en la espalda o el abdomen.  Se siente nauseoso o vomita.  Siente debilidad intensa.  Se desmaya.  Tiene escalofros. Esto es Radio broadcast assistant. No espere a ver si el dolor se pasa. Obtenga ayuda mdica de inmediato. Llame a los servicios de emergencia locales (911 en los [place]). No conduzca por sus propios medios [employer] [facility]. ASEGRESE DE QUE:   Comprende estas instrucciones.  Controlar su afeccin.  Recibir ayuda de inmediato si no mejora o si empeora. Document Released: 04/13/2005 Document Revised: 04/18/2013 [facility] Patient Information 2015 [place], [place]. This information is not intended to replace advice given to you by your health care provider. Make sure you discuss any questions you have with your health care provider.  Dolor de cabeza general sin causa  (General Headache Without Cause)  Un dolor de cabeza en general es un dolor o malestar que se siente en la zona de la cabeza o del cuello. Se desconocen las causas.  CUIDADOS EN EL HOGAR   Cumpla con los controles mdicos segn las indicaciones.  Tome slo los medicamentos que le haya indicado el mdico.  Cuando sienta dolor de cabeza acustese en un cuarto oscuro y tranquilo.  Lleve un registro diario para averiguar si ciertas cosas provocan dolores de cabeza. Por ejemplo, escriba:  Lo que come y bebe.  Cunto tiempo duerme.  Todo cambio en la dieta o medicamentos.  Reljese recibiendo masajes o haga otras actividades relajantes.  Coloque hielo o calor en la cabeza y el cuello como lo indique su mdico.  DISMINUYA EL NIVEL DE ESTRS  Sintase con la espalda recta. No apriete los msculos (tensione).  Si fuma, deje de hacerlo.  Beba menos alcohol.  Consuma menos cafena o deje de tomarla.  Coma y duerma en horarios regulares.  Duerma entre 7 y 9  horas o como le indique su mdico.  Dietitian las luces tenues si le [employer] luces brillantes o sus dolores de cabeza empeoran. SOLICITE AYUDA DE INMEDIATO SI:   El dolor de [place].  Tiene fiebre.  Presenta rigidez en el cuello.  Tiene dificultad para ver.  Sus msculos estn dbiles o pierde el control muscular.  Pierde equilibrio o tiene problemas para Advertising account planner.  Siente que se desvanece (debilidad) o se desmaya.  Tiene sntomas intensos que son diferentes a los primeros sntomas.  Tiene problemas con los medicamentos que le recet su mdico.  El medicamento no le hace efecto.  Siente que el dolor de [place] es diferente a otros dolores de [place].  Tiene malestar estomacal (nuseas) o (vmitos). ASEGRESE DE QUE:   Comprende estas instrucciones.  Controlar su enfermedad.  Solicitar ayuda de inmediato si no mejora o si empeora. Document Released: 07/06/2011 [facility] Patient Information 2015 [place], [place]. This information is not intended to replace advice given to you by your health care provider. Make sure you discuss any questions you have with your health care provider.

## 2015-01-27 ENCOUNTER — Emergency Department (HOSPITAL_COMMUNITY)
Admission: EM | Admit: 2015-01-27 | Discharge: 2015-01-27 | Disposition: A | Discharge status: Home / Self Care | Payer: No Typology Code available for payment source | Attending: Emergency Medicine | Admitting: Emergency Medicine

## 2015-01-27 ENCOUNTER — Emergency Department (HOSPITAL_COMMUNITY): Payer: No Typology Code available for payment source

## 2015-01-27 DIAGNOSIS — Y998 Other external cause status: Secondary | ICD-10-CM | POA: Insufficient documentation

## 2015-01-27 DIAGNOSIS — Z79899 Other long term (current) drug therapy: Secondary | ICD-10-CM | POA: Diagnosis not present

## 2015-01-27 DIAGNOSIS — S4991XA Unspecified injury of right shoulder and upper arm, initial encounter: Secondary | ICD-10-CM | POA: Insufficient documentation

## 2015-01-27 DIAGNOSIS — S199XXA Unspecified injury of neck, initial encounter: Secondary | ICD-10-CM | POA: Diagnosis not present

## 2015-01-27 DIAGNOSIS — S4992XA Unspecified injury of left shoulder and upper arm, initial encounter: Secondary | ICD-10-CM | POA: Insufficient documentation

## 2015-01-27 DIAGNOSIS — J45909 Unspecified asthma, uncomplicated: Secondary | ICD-10-CM | POA: Insufficient documentation

## 2015-01-27 DIAGNOSIS — Y9389 Activity, other specified: Secondary | ICD-10-CM | POA: Insufficient documentation

## 2015-01-27 DIAGNOSIS — Y92410 Unspecified street and highway as the place of occurrence of the external cause: Secondary | ICD-10-CM | POA: Diagnosis not present

## 2015-01-27 DIAGNOSIS — S0990XA Unspecified injury of head, initial encounter: Secondary | ICD-10-CM | POA: Diagnosis present

## 2015-01-27 MED ORDER — IBUPROFEN 800 MG PO TABS
800.0000 mg | ORAL_TABLET | Freq: Once | ORAL | Status: AC
  Administered 2015-01-27: 800 mg via ORAL
  Filled 2015-01-27: qty 1

## 2015-01-27 MED ORDER — OXYCODONE HCL 5 MG PO TABS
5.0000 mg | ORAL_TABLET | Freq: Once | ORAL | Status: AC
Start: 2015-01-27 — End: 2015-01-27
  Administered 2015-01-27: 5 mg via ORAL
  Filled 2015-01-27: qty 1

## 2015-01-27 MED ORDER — ACETAMINOPHEN 500 MG PO TABS
1000.0000 mg | ORAL_TABLET | Freq: Once | ORAL | Status: AC
  Administered 2015-01-27: 1000 mg via ORAL
  Filled 2015-01-27: qty 2

## 2015-01-27 NOTE — ED Notes (Signed)
PT to XR and CT.
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
## 2016-05-06 ENCOUNTER — Emergency Department (HOSPITAL_COMMUNITY)
Admission: EM | Admit: 2016-05-06 | Discharge: 2016-05-06 | Disposition: A | Discharge status: Home / Self Care | Payer: Self-pay | Attending: Emergency Medicine | Admitting: Emergency Medicine

## 2016-05-06 ENCOUNTER — Emergency Department (HOSPITAL_COMMUNITY): Payer: Self-pay

## 2016-05-06 ENCOUNTER — Encounter (HOSPITAL_COMMUNITY): Payer: Self-pay | Admitting: Emergency Medicine

## 2016-05-06 DIAGNOSIS — Y999 Unspecified external cause status: Secondary | ICD-10-CM | POA: Insufficient documentation

## 2016-05-06 DIAGNOSIS — Y929 Unspecified place or not applicable: Secondary | ICD-10-CM | POA: Insufficient documentation

## 2016-05-06 DIAGNOSIS — J45909 Unspecified asthma, uncomplicated: Secondary | ICD-10-CM | POA: Insufficient documentation

## 2016-05-06 DIAGNOSIS — Y939 Activity, unspecified: Secondary | ICD-10-CM | POA: Insufficient documentation

## 2016-05-06 DIAGNOSIS — X58XXXA Exposure to other specified factors, initial encounter: Secondary | ICD-10-CM | POA: Insufficient documentation

## 2016-05-06 DIAGNOSIS — S46811A Strain of other muscles, fascia and tendons at shoulder and upper arm level, right arm, initial encounter: Secondary | ICD-10-CM | POA: Insufficient documentation

## 2016-05-06 LAB — BASIC METABOLIC PANEL
ANION GAP: 5 (ref 5–15)
BUN: 15 mg/dL (ref 6–20)
CALCIUM: 8.8 mg/dL — AB (ref 8.9–10.3)
CO2: 25 mmol/L (ref 22–32)
CREATININE: 0.77 mg/dL (ref 0.44–1.00)
Chloride: 108 mmol/L (ref 101–111)
Glucose, Bld: 110 mg/dL — ABNORMAL HIGH (ref 65–99)
Potassium: 4 mmol/L (ref 3.5–5.1)
SODIUM: 138 mmol/L (ref 135–145)

## 2016-05-06 LAB — CBC
HCT: 38 % (ref 36.0–46.0)
HEMOGLOBIN: 13.2 g/dL (ref 12.0–15.0)
MCH: 30.5 pg (ref 26.0–34.0)
MCHC: 34.7 g/dL (ref 30.0–36.0)
MCV: 87.8 fL (ref 78.0–100.0)
PLATELETS: 233 10*3/uL (ref 150–400)
RBC: 4.33 MIL/uL (ref 3.87–5.11)
RDW: 12.2 % (ref 11.5–15.5)
WBC: 5.5 10*3/uL (ref 4.0–10.5)

## 2016-05-06 LAB — I-STAT TROPONIN, ED: TROPONIN I, POC: 0 ng/mL (ref 0.00–0.08)

## 2016-05-06 MED ORDER — NAPROXEN 500 MG PO TABS
500.0000 mg | ORAL_TABLET | Freq: Two times a day (BID) | ORAL | Status: DC
  Administered 2016-05-06: 500 mg via ORAL
  Filled 2016-05-06: qty 1

## 2016-05-06 MED ORDER — ACETAMINOPHEN 500 MG PO TABS
1000.0000 mg | ORAL_TABLET | Freq: Once | ORAL | Status: AC
  Administered 2016-05-06: 1000 mg via ORAL
  Filled 2016-05-06: qty 2
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
## 2016-05-06 NOTE — ED Triage Notes (Addendum)
Per interpreter, patient reports right sided chest pressure with radiation to the right arm and jaw x2 days. Patient also reports right sided headache with numbness and tingling to the right arm and leg. Denies abdominal pain, N/V/D. Ambulatory to triage. Bilateral hand grips equal.

## 2016-05-06 NOTE — Discharge Instructions (Signed)
Take 2 over-the-counter naproxen tablets twice a day for pain. Also take tylenol 1000mg (2 extra strength) four times a day.   You need to take your arm out of the sling and move it around at least 4 times a day or your shoulder will get worse.

## 2016-05-06 NOTE — ED Notes (Signed)
Bed: [id number] Expected date:  Expected time:  Means of arrival:  Comments: EMS 46yo, AMS

## 2017-01-20 ENCOUNTER — Emergency Department (HOSPITAL_COMMUNITY)
Admission: EM | Admit: 2017-01-20 | Discharge: 2017-01-20 | Disposition: A | Discharge status: Home / Self Care | Payer: Self-pay | Attending: Emergency Medicine | Admitting: Emergency Medicine

## 2017-01-20 ENCOUNTER — Encounter (HOSPITAL_COMMUNITY): Payer: Self-pay | Admitting: Emergency Medicine

## 2017-01-20 DIAGNOSIS — M79675 Pain in left toe(s): Secondary | ICD-10-CM | POA: Insufficient documentation

## 2017-01-20 DIAGNOSIS — G8929 Other chronic pain: Secondary | ICD-10-CM | POA: Insufficient documentation

## 2017-01-20 DIAGNOSIS — M25561 Pain in right knee: Secondary | ICD-10-CM | POA: Insufficient documentation

## 2017-01-20 MED ORDER — KETOROLAC TROMETHAMINE 60 MG/2ML IM SOLN
60.0000 mg | Freq: Once | INTRAMUSCULAR | Status: AC
  Administered 2017-01-20: 60 mg via INTRAMUSCULAR
  Filled 2017-01-20: qty 2

## 2017-01-20 NOTE — ED Triage Notes (Signed)
Pt reports right knee pain that radiates down to her right ankle as well as left big toe pain x1 month, denies any falls or injury to either area, pt ambulatory to triage, a/ox4, resp e/u, nad.

## 2017-01-20 NOTE — Progress Notes (Signed)
Orthopedic Tech Progress Note Patient Details:  Sydney Lopez 09-08-1970 [id number]  Ortho Devices Type of Ortho Device: Knee Sleeve Ortho Device/Splint Interventions: Application   Saul Fordyce 01/20/2017, 12:36 PM

## 2017-01-20 NOTE — Discharge Instructions (Signed)
Please continue to take ibuprofen for your knee pain. You can take up to  every 6 hours for pain. Please wear the knee sleeve for support of your right knee while at work. Please apply heat over the knee for relief of pain.  I have given you a list of knee exercises to complete as tolerated.  Elevate your legs at night after a long day of work.   It is important for you to establish care at a primary care doctor's office. I have given you  the information to establish care at [facility]. They take patients who do not have insurance.   Return to the ER for any new or worsening symptoms.

## 2017-01-20 NOTE — ED Provider Notes (Signed)
[facility] Provider Note   CSN: [id number] Arrival date & time: 01/20/17  8295     History   Chief Complaint Chief Complaint  Patient presents with  . Toe Pain  . Knee Pain    HPI Lenor Provencher is a 46 y.o. female.  HPI   Ms. Darlin Drop is a 46 year old female with no significant past medical history who presents to the emergency department for evaluation of right knee pain and left second toe pain which has been ongoing for the past month. Patient states that her pain is on the medial aspect of the knee joint. Pain is a 10/10 in severity, "prickling" in nature and radiates down into her right foot. It is worsened with weight bearing or any movement of the knee joint. She has tried placing heat over the knee with temporary symptom relief. She also states that she has been taking 200 mg of ibuprofen every 6 hours with some pain relief. She denies inciting injury, denies previous knee surgery. She denies numbness, weakness in the right leg or foot. She works at [employer] and is on her feet for approximately 15 hours a day. States that she was seen about 3 weeks ago in the ER for similar pain and was told to take ibuprofen. Her toe pain is in the second toe on the left foot. It is worsened with weight bearing and has also been going on for about a month now.   Past Medical History:  Diagnosis Date  . Asthma     There are no active problems to display for this patient.   Past Surgical History:  Procedure Laterality Date  . CHOLECYSTECTOMY      OB History    Gravida Para Term Preterm AB Living   SAB TAB Ectopic Multiple Live Births   1               Home Medications    Prior to Admission medications   Medication Sig Start Date End Date Taking? Authorizing Provider  albuterol (PROVENTIL HFA;VENTOLIN HFA) 108 (90 BASE) MCG/ACT inhaler Inhale 2 puffs into the lungs every 6 (six) hours as needed for wheezing or shortness of breath. 07/23/14    [name], MD  azithromycin [facility]) 250 MG tablet As packaged Patient not taking: Reported on 01/10/2015 07/23/14   [name], MD  predniSONE (DELTASONE) 20 MG tablet 3/3/2/2/1/1 single daily dose for 6 days starting Tuesday morning Patient not taking: Reported on 01/10/2015 07/23/14   [name], MD  traMADol (ULTRAM) 50 MG tablet Take 50 mg by mouth every 6 (six) hours as needed for moderate pain.    [provider]    Family History Family History  Problem Relation Age of Onset  . Diabetes Brother     Social History Social History  Substance Use Topics  . Smoking status: Never Smoker  . Smokeless tobacco: Never Used  . Alcohol use No     Allergies   Ibuprofen   Review of Systems Review of Systems  Constitutional: Negative for chills and fever.  Musculoskeletal: Positive for arthralgias (right knee). Negative for gait problem.  Skin: Negative for color change, rash and wound.  Neurological: Negative for weakness and numbness.     Physical Exam Updated Vital Signs BP 108/72   Pulse 77   Temp 97.9 F (36.6 C) (Oral)   Resp 12   SpO2 98%   Physical Exam  Constitutional: She is oriented to person, place, and time. She appears well-developed and well-nourished. No distress.  HENT:  Head: Normocephalic and atraumatic.  Eyes: Right eye exhibits no discharge. Left eye exhibits no discharge.  Pulmonary/Chest: Effort normal. No respiratory distress.  Musculoskeletal:  Tenderness to palpation over the medial aspect of the right knee. Mild tenderness with valgus stress over the right knee joint, no laxity. No joint effusion or swelling appreciated. No abnormal alignment or patellar mobility. No bruising, erythema or warmth overlaying the joint. Negative drawer's, Lachman's and McMurray's.  No crepitus. Full ROM of knee, ankle and toes bilaterally. Strength 5/5 in bilateral lower extremities.  Neurological: She is alert and oriented to  person, place, and time. Coordination normal.  Distal sensation to light/sharp touch intact in bilateral lower extremities. Patellar reflex 2+. Gait normal.  Skin: Skin is warm and dry. Capillary refill takes less than 2 seconds. She is not diaphoretic.  Psychiatric: She has a normal mood and affect. Her behavior is normal.  Nursing note and vitals reviewed.    ED Treatments / Results  Labs (all labs ordered are listed, but only abnormal results are displayed) Labs Reviewed - No data to display  EKG  EKG Interpretation None       Radiology No results found.  Procedures Procedures (including critical care time)  Medications Ordered in ED Medications  ketorolac (TORADOL) injection 60 mg (not administered)     Initial Impression / Assessment and Plan / ED Course  I have reviewed the triage vital signs and the nursing notes.  Pertinent labs & imaging results that were available during my care of the patient were reviewed by me and considered in my medical decision making (see chart for details).     Patient with chronic right knee pain, no inciting injury. She is able to ambulate independently, although painful. No joint line tenderness, bony deformity. Strength 5/5 and ROM full. Given exam, do not think that she has a fracture needing Xray at this time. Discussed symptomatic treatment with NSAIDs, heat. Sent patient home with knee sleeve for stability and support. Patient agrees to plan and voices understanding.    Final Clinical Impressions(s) / ED Diagnoses   Final diagnoses:  Chronic pain of right knee  Pain of toe of left foot    New Prescriptions New Prescriptions   No medications on file     Lawrence Marseilles 01/20/17 1900    Tegeler, Canary Brim, MD 01/20/17 [phone]

## 2017-02-23 ENCOUNTER — Encounter (HOSPITAL_COMMUNITY): Payer: Self-pay | Admitting: Family Medicine

## 2017-02-23 ENCOUNTER — Ambulatory Visit (HOSPITAL_COMMUNITY)
Admission: EM | Admit: 2017-02-23 | Discharge: 2017-02-23 | Disposition: A | Discharge status: Home / Self Care | Payer: Self-pay | Attending: Family Medicine | Admitting: Family Medicine

## 2017-02-23 DIAGNOSIS — G8929 Other chronic pain: Secondary | ICD-10-CM

## 2017-02-23 DIAGNOSIS — M25561 Pain in right knee: Secondary | ICD-10-CM

## 2017-02-23 DIAGNOSIS — M79672 Pain in left foot: Secondary | ICD-10-CM

## 2017-02-23 DIAGNOSIS — M79671 Pain in right foot: Secondary | ICD-10-CM

## 2017-02-23 DIAGNOSIS — M25562 Pain in left knee: Secondary | ICD-10-CM

## 2017-02-23 MED ORDER — MELOXICAM 15 MG PO TABS: 15.0000 mg | ORAL_TABLET | Freq: Every day | ORAL | 0 refills | Status: DC

## 2017-02-23 NOTE — ED Provider Notes (Signed)
[facility]    CSN: [id number] Arrival date & time: 02/23/17  1007     History   Chief Complaint Chief Complaint  Patient presents with  . Knee Pain  . Foot Pain    HPI Quaniya Damas is a 46 y.o. female.   Marilouise presents with complaints of bilateral knee and feet pain which has persisted >1 mo. Spanish video interpreter used to collect history and physical. She states she has been wearing a sleeve to her right knee which helps some. She works at [employer] and is on her feet all day daily. Pain is worse in the morning when she wakes. Taking a warm shower helps with the pain. She has put insoles in her shoes which minimally helps her feet. She has been taking ibuprofen every 8 hours which does not seem to be helping. No specific injury to her knees or feet. Denies numbness or tingling. Pain is worse with weight bearing. Pain is moderate in severity. Without swelling, rash or redness. Without fevers or chills. No previous knee or foot surgery.    ROS per HPI.       Past Medical History:  Diagnosis Date  . Asthma     There are no active problems to display for this patient.   Past Surgical History:  Procedure Laterality Date  . CHOLECYSTECTOMY      OB History    Gravida Para Term Preterm AB Living   5 4 4   1 4    SAB TAB Ectopic Multiple Live Births   1               Home Medications    Prior to Admission medications   Medication Sig Start Date End Date Taking? Authorizing Provider  albuterol (PROVENTIL HFA;VENTOLIN HFA) 108 (90 BASE) MCG/ACT inhaler Inhale 2 puffs into the lungs every 6 (six) hours as needed for wheezing or shortness of breath. 07/23/14   [name], MD  azithromycin (ZITHROMAX) 250 MG tablet As packaged Patient not taking: Reported on 01/10/2015 07/23/14   [name], MD  meloxicam (MOBIC) 15 MG tablet Take 1 tablet (15 mg total) by mouth daily. 02/23/17   Georgetta Haber, NP  predniSONE (DELTASONE) 20 MG  tablet 3/3/2/2/1/1 single daily dose for 6 days starting Tuesday morning Patient not taking: Reported on 01/10/2015 07/23/14   [name], MD  traMADol (ULTRAM) 50 MG tablet Take 50 mg by mouth every 6 (six) hours as needed for moderate pain.    [provider]    Family History Family History  Problem Relation Age of Onset  . Diabetes Brother     Social History Social History  Substance Use Topics  . Smoking status: Never Smoker  . Smokeless tobacco: Never Used  . Alcohol use No     Allergies   Ibuprofen   Review of Systems Review of Systems   Physical Exam Triage Vital Signs ED Triage Vitals [02/23/17 1102]  Enc Vitals Group     BP 133/75     Pulse Rate 90     Resp 18     Temp 98 F (36.7 C)     Temp src      SpO2 100 %     Weight      Height      Head Circumference      Peak Flow      Pain Score      Pain Loc  Pain Edu?      Excl. in GC?    No data found.   Updated Vital Signs BP 133/75   Pulse 90   Temp 98 F (36.7 C)   Resp 18   SpO2 100%   Visual Acuity Right Eye Distance:   Left Eye Distance:   Bilateral Distance:    Right Eye Near:   Left Eye Near:    Bilateral Near:     Physical Exam  Constitutional: She is oriented to person, place, and time. She appears well-developed and well-nourished. No distress.  Cardiovascular: Normal rate, regular rhythm and normal heart sounds.   Pulmonary/Chest: Effort normal and breath sounds normal.  Musculoskeletal:       Right knee: She exhibits normal range of motion, no swelling, no effusion, no ecchymosis, no deformity, no laceration, no erythema, normal alignment, no LCL laxity, normal patellar mobility, no bony tenderness, normal meniscus and no MCL laxity. Tenderness found. Medial joint line and MCL tenderness noted. No lateral joint line, no LCL and no patellar tendon tenderness noted.       Left knee: She exhibits normal range of motion, no swelling, no effusion, no  ecchymosis, no deformity, no laceration, no erythema, normal alignment, no LCL laxity, normal patellar mobility, no bony tenderness, normal meniscus and no MCL laxity. Tenderness found. Medial joint line and MCL tenderness noted. No lateral joint line, no LCL and no patellar tendon tenderness noted.       Right foot: There is tenderness. There is normal range of motion, no bony tenderness, no swelling and normal capillary refill.       Left foot: There is tenderness. There is normal range of motion, no bony tenderness, no swelling and normal capillary refill.  Pain to bilateral medial knees on palpation; negative drawer; without pain with active and passive flexion and extension; pain with weight bearing; right heel pain on palpation, not exacerbated by toe dorsiflexion; left foot pad pain with weight bearing; without either foot or knee redness, swelling or warmth; sensation intact; strong and equal pedal pulses  Neurological: She is alert and oriented to person, place, and time.  Skin: Skin is warm and dry.     UC Treatments / Results  Labs (all labs ordered are listed, but only abnormal results are displayed) Labs Reviewed - No data to display  EKG  EKG Interpretation None       Radiology No results found.  Procedures Procedures (including critical care time)  Medications Ordered in UC Medications - No data to display   Initial Impression / Assessment and Plan / UC Course  I have reviewed the triage vital signs and the nursing notes.  Pertinent labs & imaging results that were available during my care of the patient were reviewed by me and considered in my medical decision making (see chart for details).     Without specific bony tenderness or injury, imaging deferred at this time. Symptoms likely consistent with arthritis. Opted to try switching to meloxicam, discontinue ibuprofen. Ice, use of knee sleeves, supportive shoes. Follow up with orthopedics as needed, may benefit  from knee injections in the future. If symptoms worsen or do not improve in the next week to return to be seen or to follow up with PCP. Patient verbalized understanding and agreeable to plan. Ambulatory out of clinic without difficulty.    Georgetta HaberNatalie B Burky, NP 02/23/2017 11:45 AM   Final Clinical Impressions(s) / UC Diagnoses   Final diagnoses:  Chronic pain of  both knees  Foot pain, bilateral    New Prescriptions New Prescriptions   MELOXICAM (MOBIC) 15 MG TABLET    Take 1 tablet (15 mg total) by mouth daily.     Controlled Substance Prescriptions [place] Controlled Substance Registry consulted? Not Applicable   Georgetta Haber, NP 02/23/17 1145

## 2017-02-23 NOTE — ED Triage Notes (Signed)
Pt here for bilateral foot and knee pain. Reports x 1 month

## 2017-03-17 ENCOUNTER — Encounter (HOSPITAL_COMMUNITY): Payer: Self-pay

## 2017-08-31 ENCOUNTER — Encounter (HOSPITAL_COMMUNITY): Payer: Self-pay

## 2017-08-31 ENCOUNTER — Emergency Department (HOSPITAL_COMMUNITY)
Admission: EM | Admit: 2017-08-31 | Discharge: 2017-09-01 | Disposition: A | Discharge status: Home / Self Care | Payer: Self-pay | Attending: Emergency Medicine | Admitting: Emergency Medicine

## 2017-08-31 DIAGNOSIS — J45909 Unspecified asthma, uncomplicated: Secondary | ICD-10-CM | POA: Insufficient documentation

## 2017-08-31 DIAGNOSIS — R21 Rash and other nonspecific skin eruption: Secondary | ICD-10-CM | POA: Insufficient documentation

## 2017-08-31 DIAGNOSIS — Z79899 Other long term (current) drug therapy: Secondary | ICD-10-CM | POA: Insufficient documentation

## 2017-08-31 DIAGNOSIS — J209 Acute bronchitis, unspecified: Secondary | ICD-10-CM | POA: Insufficient documentation

## 2017-08-31 NOTE — ED Triage Notes (Signed)
Pt reports that she has a rash that has been going on for two weeks with red dots, no rash noted, pt reports that that a few hours ago she began to have a headache, sore throat and fevers. Afebrile in triage, spanish speaking only

## 2017-09-01 MED ORDER — AZITHROMYCIN 250 MG PO TABS: 250.0000 mg | ORAL_TABLET | Freq: Every day | ORAL | 0 refills | Status: DC

## 2017-09-01 MED ORDER — HYDROCORTISONE 1 % EX CREA: TOPICAL_CREAM | CUTANEOUS | 0 refills | Status: DC

## 2017-09-01 NOTE — ED Provider Notes (Signed)
[facility] EMERGENCY DEPARTMENT Provider Note   CSN: [id number] Arrival date & time: 08/31/17  2225     History   Chief Complaint Chief Complaint  Patient presents with  . Rash  . Migraine    HPI Sydney Lopez is a 47 y.o. female.  HPI Patient is a 47 year old female with a history of asthma and without a primary care physician who presents to the emergency department because of a rash that is been present over the past 2 weeks.  She describes small bumps over her arms and legs and chest and abdomen which itch.  She has no pets in her house.  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  She states no one else in her house has a rash.  Her secondary complaint is low-grade fevers today productive cough and sore throat with nasal congestion.  Reports some nausea without vomiting.  Denies diarrhea.  Mild headache at this time.  No neck pain or neck stiffness.  Denies abdominal pain.  No dysuria or urinary frequency.  Symptoms are mild to moderate in severity.   Past Medical History:  Diagnosis Date  . Asthma     There are no active problems to display for this patient.   Past Surgical History:  Procedure Laterality Date  . CHOLECYSTECTOMY       OB History    Gravida  5   Para  4   Term  4   Preterm      AB  1   Living  4     SAB  1   TAB      Ectopic      Multiple      Live Births               Home Medications    Prior to Admission medications   Medication Sig Start Date End Date Taking? Authorizing Provider  albuterol (PROVENTIL HFA;VENTOLIN HFA) 108 (90 BASE) MCG/ACT inhaler Inhale 2 puffs into the lungs every 6 (six) hours as needed for wheezing or shortness of breath. 07/23/14   [name], MD  azithromycin (ZITHROMAX Z-PAK) 250 MG tablet Take 1 tablet (250 mg total) by mouth daily. Take 2 tabs for first dose, then 1 tab for each additional dose 09/01/17   Azalia Bilis, MD    hydrocortisone cream 1 % Apply to affected area 2 times daily 09/01/17   Azalia Bilis, MD  meloxicam (MOBIC) 15 MG tablet Take 1 tablet (15 mg total) by mouth daily. 02/23/17   Georgetta Haber, NP  predniSONE (DELTASONE) 20 MG tablet 3/3/2/2/1/1 single daily dose for 6 days starting Tuesday morning Patient not taking: Reported on 01/10/2015 07/23/14   [name], MD  traMADol (ULTRAM) 50 MG tablet Take 50 mg by mouth every 6 (six) hours as needed for moderate pain.    [provider]    Family History Family History  Problem Relation Age of Onset  . Diabetes Brother     Social History Social History   Tobacco Use  . Smoking status: Never Smoker  . Smokeless tobacco: Never Used  Substance Use Topics  . Alcohol use: No  . Drug use: No     Allergies   Ibuprofen   Review of Systems Review of Systems  All other systems reviewed and are negative.    Physical Exam Updated Vital Signs BP 113/68 (BP Location: Right Arm)   Pulse 74  Temp 97.8 F (36.6 C) (Oral)   Resp 14   SpO2 100%   Physical Exam  Constitutional: She is oriented to person, place, and time. She appears well-developed and well-nourished. No distress.  HENT:  Head: Normocephalic and atraumatic.  Eyes: EOM are normal.  Neck: Normal range of motion.  Cardiovascular: Normal rate, regular rhythm and normal heart sounds.  Pulmonary/Chest: Effort normal and breath sounds normal.  Abdominal: Soft. She exhibits no distension. There is no tenderness.  Musculoskeletal: Normal range of motion.  Neurological: She is alert and oriented to person, place, and time.  Skin: Skin is warm and dry.  Very mild papular rash of her arms back and torso.  Psychiatric: She has a normal mood and affect. Judgment normal.  Nursing note and vitals reviewed.    ED Treatments / Results  Labs (all labs ordered are listed, but only abnormal results are displayed) Labs Reviewed - No data to  display  EKG None  Radiology No results found.  Procedures Procedures (including critical care time)  Medications Ordered in ED Medications - No data to display   Initial Impression / Assessment and Plan / ED Course  I have reviewed the triage vital signs and the nursing notes.  Pertinent labs & imaging results that were available during my care of the patient were reviewed by me and considered in my medical decision making (see chart for details).     Rash concerning for bug bites or flea bites.  Could represent bedbugs.  She does do housekeeping.  Her symptoms otherwise sound like acute bronchitis with acute symptoms beginning today.  Short course of antibiotics.  Lungs are clear.  Vital signs are stable.  Much of the difficulty as the patient does not have a primary care physician.  Many of these complaints are best managed with the primary care physician.  This was explained to the patient.  She understands return to the ER for new or worsening symptoms.  She understands the importance of close primary care follow-up.  Language translator services were utilized for history of physical and discharge instructions as well as return precautions  Final Clinical Impressions(s) / ED Diagnoses   Final diagnoses:  Acute bronchitis, unspecified organism  Rash    ED Discharge Orders        Ordered    azithromycin (ZITHROMAX Z-PAK) 250 MG tablet  Daily     09/01/17 0442    hydrocortisone cream 1 %     09/01/17 0442       Azalia Bilis, MD 09/01/17 [phone]

## 2017-09-01 NOTE — ED Notes (Signed)
PT states understanding of care given, follow up care, and medication prescribed. PT ambulated from ED to car with a steady gait. 

## 2017-09-01 NOTE — ED Notes (Signed)
Signature pad not working. 

## 2017-09-14 ENCOUNTER — Ambulatory Visit (HOSPITAL_COMMUNITY)
Admission: EM | Admit: 2017-09-14 | Discharge: 2017-09-14 | Disposition: A | Discharge status: Home / Self Care | Payer: Self-pay | Attending: Family Medicine | Admitting: Family Medicine

## 2017-09-14 ENCOUNTER — Encounter (HOSPITAL_COMMUNITY): Payer: Self-pay | Admitting: Emergency Medicine

## 2017-09-14 DIAGNOSIS — R21 Rash and other nonspecific skin eruption: Secondary | ICD-10-CM

## 2017-09-14 MED ORDER — PREDNISONE 10 MG (21) PO TBPK: ORAL_TABLET | Freq: Every day | ORAL | 0 refills | Status: DC

## 2017-09-14 NOTE — ED Provider Notes (Signed)
[facility]    CSN: [id number] Arrival date & time: 09/14/17  1154     History   Chief Complaint Chief Complaint  Patient presents with  . Rash    HPI Sydney Lopez is a 47 y.o. female.   Sydney Lopez presents with complaints of rash which has been persistent and worsening over the past 4 weeks. Spanish video interpreter used to collect history and physical. Has tried hydrocortisone cream as well as permethrin treatments which have not helped with rash. Has tried hydroxizine for itching which has not helped. Still with itching. No pain. Started to torso and now is also to arms and legs/feet. No pets in home. Did clean a home a few weeks ago with pets, has not since. No other in her household with rash. Feels rash has continued to spread. No specific known fevers, cough, uri symptoms, joint aches, pain or swelling. Denies any previous similar.    ROS per HPI.      Past Medical History:  Diagnosis Date  . Asthma     There are no active problems to display for this patient.   Past Surgical History:  Procedure Laterality Date  . CHOLECYSTECTOMY      OB History    Gravida  5   Para  4   Term  4   Preterm      AB  1   Living  4     SAB  1   TAB      Ectopic      Multiple      Live Births               Home Medications    Prior to Admission medications   Medication Sig Start Date End Date Taking? Authorizing Provider  albuterol (PROVENTIL HFA;VENTOLIN HFA) 108 (90 BASE) MCG/ACT inhaler Inhale 2 puffs into the lungs every 6 (six) hours as needed for wheezing or shortness of breath. 07/23/14   [name], MD  azithromycin (ZITHROMAX Z-PAK) 250 MG tablet Take 1 tablet (250 mg total) by mouth daily. Take 2 tabs for first dose, then 1 tab for each additional dose 09/01/17   Azalia Bilis, MD  hydrocortisone cream 1 % Apply to affected area 2 times daily 09/01/17   Azalia Bilis, MD  meloxicam (MOBIC) 15 MG tablet Take 1 tablet (15 mg  total) by mouth daily. 02/23/17   Georgetta Haber, NP  predniSONE (STERAPRED UNI-PAK 21 TAB) 10 MG (21) TBPK tablet Take by mouth daily. Take 6 tabs by mouth daily  for 2 days, then 5 tabs for 2 days, then 4 tabs for 2 days, then 3 tabs for 2 days, 2 tabs for 2 days, then 1 tab by mouth daily for 2 days 09/14/17   Linus Mako B, NP  traMADol (ULTRAM) 50 MG tablet Take 50 mg by mouth every 6 (six) hours as needed for moderate pain.    [provider]    Family History Family History  Problem Relation Age of Onset  . Diabetes Brother     Social History Social History   Tobacco Use  . Smoking status: Never Smoker  . Smokeless tobacco: Never Used  Substance Use Topics  . Alcohol use: No  . Drug use: No     Allergies   Ibuprofen   Review of Systems Review of Systems   Physical Exam Triage Vital Signs ED Triage Vitals [09/14/17 1239]  Enc Vitals Group     BP 106/69  Pulse Rate (!) 105     Resp 18     Temp 98.7 F (37.1 C)     Temp Source Oral     SpO2 98 %     Weight      Height      Head Circumference      Peak Flow      Pain Score 0     Pain Loc      Pain Edu?      Excl. in GC?    No data found.  Updated Vital Signs BP 106/69 (BP Location: Left Arm)   Pulse (!) 105   Temp 98.7 F (37.1 C) (Oral)   Resp 18   SpO2 98%    Physical Exam  Constitutional: She is oriented to person, place, and time. She appears well-developed and well-nourished. No distress.  Cardiovascular: Normal rate, regular rhythm and normal heart sounds.  Pulmonary/Chest: Effort normal and breath sounds normal.  Neurological: She is alert and oriented to person, place, and time.  Skin: Skin is warm and dry. Rash noted. Rash is papular.  Raised red papular rash scattered to arms, dorsal hands, chest, abdomen, legs and thighs. Spares neck and face as well as feet. No rash to interdigits. Without vesicles or scabs noted; see photos                           UC Treatments / Results  Labs (all labs ordered are listed, but only abnormal results are displayed) Labs Reviewed - No data to display  EKG None  Radiology No results found.  Procedures Procedures (including critical care time)  Medications Ordered in UC Medications - No data to display  Initial Impression / Assessment and Plan / UC Course  I have reviewed the triage vital signs and the nursing notes.  Pertinent labs & imaging results that were available during my care of the patient were reviewed by me and considered in my medical decision making (see chart for details).     No longer near pets and rash persists and continues to spread. Without pain. No scabbing. Has cleansed home thoroughly and no others in home with rash. Bed bugs still considered vs benign rash such as pityriasis although without any definitive signs of either. Will try course of oral prednisone. Continue to follow up for persistent symptoms, dermatology as needed. Patient verbalized understanding and agreeable to plan.    Case and photos discussed with supervising physician Dr. Tracie Harrier Final Clinical Impressions(s) / UC Diagnoses   Final diagnoses:  Rash and nonspecific skin eruption     Discharge Instructions     We will try a course of oral steroids to see if this is helpful with your symptoms. It is still difficult to tell if this is related to bug bites vs another cause of rash such as pityriasis.  Please continue to follow up here or with your primary care provider if symptoms worsen or do not improve     ED Prescriptions    Medication Sig Dispense Auth. Provider   predniSONE (STERAPRED UNI-PAK 21 TAB) 10 MG (21) TBPK tablet Take by mouth daily. Take 6 tabs by mouth daily  for 2 days, then 5 tabs for 2 days, then 4 tabs for 2 days, then 3 tabs for 2 days, 2 tabs for 2 days, then 1 tab by mouth daily for 2 days 42 tablet Linus Mako B, NP      Controlled  Substance Prescriptions [place] Controlled Substance Registry consulted? Not Applicable   Georgetta Haber, NP 09/14/17 1353

## 2017-09-14 NOTE — ED Triage Notes (Signed)
Pt here for itchy rash to arms and all over

## 2017-09-14 NOTE — Discharge Instructions (Signed)
We will try a course of oral steroids to see if this is helpful with your symptoms. It is still difficult to tell if this is related to bug bites vs another cause of rash such as pityriasis.  Please continue to follow up here or with your primary care provider if symptoms worsen or do not improve

## 2018-01-03 ENCOUNTER — Other Ambulatory Visit: Payer: Self-pay | Admitting: Obstetrics and Gynecology

## 2018-01-03 DIAGNOSIS — Z1231 Encounter for screening mammogram for malignant neoplasm of breast: Secondary | ICD-10-CM

## 2018-03-15 ENCOUNTER — Ambulatory Visit (HOSPITAL_COMMUNITY)
Admission: RE | Admit: 2018-03-15 | Discharge: 2018-03-15 | Disposition: A | Discharge status: Home / Self Care | Payer: Self-pay | Source: Ambulatory Visit | Attending: Obstetrics and Gynecology | Admitting: Obstetrics and Gynecology

## 2018-03-15 ENCOUNTER — Encounter (HOSPITAL_COMMUNITY): Payer: Self-pay

## 2018-03-15 ENCOUNTER — Ambulatory Visit
Admission: RE | Admit: 2018-03-15 | Discharge: 2018-03-15 | Disposition: A | Discharge status: Home / Self Care | Payer: Self-pay | Source: Ambulatory Visit | Attending: Obstetrics and Gynecology | Admitting: Obstetrics and Gynecology

## 2018-03-15 VITALS — BP 110/68 | Wt 163.0 lb

## 2018-03-15 DIAGNOSIS — Z01419 Encounter for gynecological examination (general) (routine) without abnormal findings: Secondary | ICD-10-CM

## 2018-03-15 DIAGNOSIS — Z1231 Encounter for screening mammogram for malignant neoplasm of breast: Secondary | ICD-10-CM

## 2018-03-15 NOTE — Patient Instructions (Signed)
Explained breast self awareness with Kahlie Bello-Reyes. Let patient know BCCCP will cover Pap smears and HPV typing every 5 years unless has a history of abnormal Pap smears. [facility]. Appointment scheduled for Tuesday, March 15, 2018 at 1310. Patient aware of appointment and will be there. Let patient know will follow up with her within the next couple weeks with results with results of Pap smear by letter or phone. Informed patient that the Breast Center will follow-up with her within the next couple of weeks with results of mammogram by letter or phone. Jabree Bello-Reyes verbalized understanding.  Brydan Downard, Kathaleen Maserhristine Poll, RN 12:31 PM

## 2018-03-15 NOTE — Progress Notes (Signed)
No complaints today.   Pap Smear: Pap smear completed today. [facility] and normal per patient. Per patient has no history of an abnormal Pap smear. Last Pap smear result is not in Epic but previous Pap smear result from 04/01/2011 is in Epic.  Physical exam: Breasts Breasts symmetrical. No skin abnormalities bilateral breasts. No nipple retraction bilateral breasts. No nipple discharge bilateral breasts. No lymphadenopathy. No lumps palpated bilateral breasts. No complaints of pain or tenderness on exam. [facility] for a screening mamm[facility]. Appointment scheduled for Tuesday, March 15, 2018 at 1310.        Pelvic/Bimanual   Ext Genitalia No lesions, no swelling and no discharge observed on external genitalia.         Vagina Vagina pink and normal texture. No lesions or discharge observed in vagina.          Cervix Cervix is present. Cervix pink and of normal texture. IUD Strings visualized. No discharge observed.     Uterus Uterus is present and palpable. Uterus in normal position and normal size.        Adnexae Bilateral ovaries present and palpable. No tenderness on palpation.         Rectovaginal No rectal exam completed today since patient had no rectal complaints. No skin abnormalities observed on exam.    Smoking History: Patient has never smoked.  Patient Navigation: Patient education provided. Access to services provided for [facility]. Spanish interpreter provided.   Breast and Cervical Cancer Risk Assessment: Patient has no family history of breast cancer, known genetic mutations, or radiation treatment to the chest before age 49. Patient has no history of cervical dysplasia, immunocompromised, or DES exposure in-utero.  Risk Assessment    Risk Scores      03/15/2018   Last edited by: Lynnell DikeHolland, Sabrina H, LPN   5-year risk: 0.6 %   Lifetime risk:  6.4 %         Used Spanish interpreter Natale LayErika McReynolds from [facility].

## 2018-03-17 LAB — CYTOLOGY - PAP
Diagnosis: NEGATIVE
HPV (WINDOPATH): NOT DETECTED

## 2018-03-23 ENCOUNTER — Encounter (HOSPITAL_COMMUNITY): Payer: Self-pay | Admitting: *Deleted

## 2018-04-13 ENCOUNTER — Encounter (HOSPITAL_COMMUNITY): Payer: Self-pay | Admitting: *Deleted

## 2018-04-13 NOTE — Progress Notes (Signed)
Letter mailed to patient with negative pap smear results. HPV was negative. Next pap smear due in five years. 

## 2019-03-03 ENCOUNTER — Other Ambulatory Visit (HOSPITAL_COMMUNITY): Payer: Self-pay | Admitting: *Deleted

## 2019-03-03 DIAGNOSIS — Z1231 Encounter for screening mammogram for malignant neoplasm of breast: Secondary | ICD-10-CM

## 2019-04-18 ENCOUNTER — Ambulatory Visit (HOSPITAL_COMMUNITY)
Admission: RE | Admit: 2019-04-18 | Discharge: 2019-04-18 | Disposition: A | Discharge status: Home / Self Care | Payer: No Typology Code available for payment source | Source: Ambulatory Visit | Attending: Obstetrics and Gynecology | Admitting: Obstetrics and Gynecology

## 2019-04-18 ENCOUNTER — Encounter (HOSPITAL_COMMUNITY): Payer: Self-pay

## 2019-04-18 ENCOUNTER — Ambulatory Visit
Admission: RE | Admit: 2019-04-18 | Discharge: 2019-04-18 | Disposition: A | Discharge status: Home / Self Care | Payer: No Typology Code available for payment source | Source: Ambulatory Visit | Attending: Obstetrics and Gynecology | Admitting: Obstetrics and Gynecology

## 2019-04-18 ENCOUNTER — Other Ambulatory Visit: Payer: Self-pay

## 2019-04-18 DIAGNOSIS — Z1231 Encounter for screening mammogram for malignant neoplasm of breast: Secondary | ICD-10-CM

## 2019-04-18 DIAGNOSIS — Z1239 Encounter for other screening for malignant neoplasm of breast: Secondary | ICD-10-CM

## 2019-04-18 NOTE — Patient Instructions (Signed)
Explained breast self awareness with Puneet Lopez. Patient did not need a Pap smear today due to last Pap smear and HPV typing was 03/15/2018. Let her know BCCCP will cover Pap smears and HPV typing every 5 years unless has a history of abnormal Pap smears. Referred patient to the [place] for a screening mammogram. Appointment scheduled for Tuesday, April 18, 2019 at 1540. Patient aware of appointment and will be there. Let patient know the Breast Center will follow up with her within the next couple weeks with results of mammogram by letter or phone. Sydney Lopez verbalized understanding.  [name], Arvil Chaco, RN 2:59 PM

## 2019-04-18 NOTE — Progress Notes (Signed)
No complaints today.   Pap Smear: Pap smear not completed today. Last Pap smear was 03/15/2018 at [facility] and normal with negative HPV. Per patient has no history of an abnormal Pap smear. Last Pap smear result is in Epic.  Physical exam: Breasts Breasts symmetrical. No skin abnormalities bilateral breasts. No nipple retraction bilateral breasts. No nipple discharge bilateral breasts. No lymphadenopathy. No lumps palpated bilateral breasts. No complaints of pain or tenderness on exam. Referred patient to the [facility] for a screening mammogram. Appointment scheduled for Tuesday, April 18, 2019 at 1540.        Pelvic/Bimanual No Pap smear completed today since last Pap smear and HPV typing was 03/15/2018. Pap smear not indicated per BCCCP guidelines.   Smoking History: Patient has never smoked.  Patient Navigation: Patient education provided. Access to services provided for patient through [facility] program. Spanish interpreter provided.   Colorectal Cancer Screening: Per patient has never had a colonoscopy completed. No complaints today.   Breast and Cervical Cancer Risk Assessment: Patient has no family history of breast cancer, known genetic mutations, or radiation treatment to the chest before age 7. Patient has no history of cervical dysplasia, immunocompromised, or DES exposure in-utero.  Risk Assessment    Risk Scores      04/18/2019 03/15/2018   Last edited by: Loletta Parish, RN Armond Hang, LPN   5-year risk: 0.7 % 0.6 %   Lifetime risk: 6.2 % 6.4 %         Used Spanish interpreter Rudene Anda from [place].

## 2019-09-06 ENCOUNTER — Encounter: Payer: Self-pay | Admitting: Gastroenterology

## 2019-09-28 ENCOUNTER — Ambulatory Visit: Payer: No Typology Code available for payment source | Admitting: Gastroenterology

## 2019-10-12 ENCOUNTER — Encounter: Payer: Self-pay | Admitting: Family Medicine

## 2019-10-12 ENCOUNTER — Ambulatory Visit: Payer: Self-pay | Attending: Family Medicine | Admitting: Family Medicine

## 2019-10-12 ENCOUNTER — Other Ambulatory Visit: Payer: Self-pay

## 2019-10-12 VITALS — BP 112/75 | HR 67 | Ht 61.0 in | Wt 158.0 lb

## 2019-10-12 DIAGNOSIS — Z13228 Encounter for screening for other metabolic disorders: Secondary | ICD-10-CM

## 2019-10-12 DIAGNOSIS — M25561 Pain in right knee: Secondary | ICD-10-CM

## 2019-10-12 MED ORDER — PREDNISONE 20 MG PO TABS: 20.0000 mg | ORAL_TABLET | Freq: Two times a day (BID) | ORAL | 0 refills | Status: DC

## 2019-10-12 MED ORDER — MELOXICAM 7.5 MG PO TABS: 7.5000 mg | ORAL_TABLET | Freq: Every day | ORAL | 3 refills | Status: DC

## 2019-10-12 NOTE — Progress Notes (Signed)
Subjective:  Patient ID: Sydney Lopez, female    DOB: Nov 11, 1970  Age: 49 y.o. MRN: [id number]  CC: New Patient (Initial Visit)   HPI Sydney Lopez presents to establish care.  She has pain on sides of her R knee radiating up her thigh. Pain is 8/10 and sometimes knee is swollen and symptoms have been present for the last few weeks. Denies history of cycling. OTC medications have not provided relief. She previously had abdominal pain which has resolved and she states she is scheduled for an upcoming endoscopy. Past Medical History:  Diagnosis Date  . Asthma     Past Surgical History:  Procedure Laterality Date  . CHOLECYSTECTOMY    . SHOULDER SURGERY      Family History  Problem Relation Age of Onset  . Diabetes Brother     Allergies  Allergen Reactions  . Ibuprofen Nausea And Vomiting    Outpatient Medications Prior to Visit  Medication Sig Dispense Refill  . albuterol (PROVENTIL HFA;VENTOLIN HFA) 108 (90 BASE) MCG/ACT inhaler Inhale 2 puffs into the lungs every 6 (six) hours as needed for wheezing or shortness of breath. (Patient not taking: Reported on 10/12/2019) 1 Inhaler 5  . azithromycin (ZITHROMAX Z-PAK) 250 MG tablet Take 1 tablet (250 mg total) by mouth daily. Take 2 tabs for first dose, then 1 tab for each additional dose (Patient not taking: Reported on 03/15/2018) 6 tablet 0  . hydrocortisone cream 1 % Apply to affected area 2 times daily (Patient not taking: Reported on 03/15/2018) 15 g 0  . meloxicam (MOBIC) 15 MG tablet Take 1 tablet (15 mg total) by mouth daily. (Patient not taking: Reported on 03/15/2018) 30 tablet 0  . predniSONE (STERAPRED UNI-PAK 21 TAB) 10 MG (21) TBPK tablet Take by mouth daily. Take 6 tabs by mouth daily  for 2 days, then 5 tabs for 2 days, then 4 tabs for 2 days, then 3 tabs for 2 days, 2 tabs for 2 days, then 1 tab by mouth daily for 2 days (Patient not taking: Reported on 03/15/2018) 42 tablet 0  . traMADol (ULTRAM) 50  MG tablet Take 50 mg by mouth every 6 (six) hours as needed for moderate pain. (Patient not taking: Reported on 10/12/2019)     Facility-Administered Medications Prior to Visit  Medication Dose Route Frequency Provider Last Rate Last Admin  . methylPREDNISolone acetate (DEPO-MEDROL) injection 80 mg  80 mg Intramuscular Once Leandrew Koyanagi, MD         ROS Review of Systems  Constitutional: Negative for activity change, appetite change and fatigue.  HENT: Negative for congestion, sinus pressure and sore throat.   Eyes: Negative for visual disturbance.  Respiratory: Negative for cough, chest tightness, shortness of breath and wheezing.   Cardiovascular: Negative for chest pain and palpitations.  Gastrointestinal: Negative for abdominal distention, abdominal pain and constipation.  Endocrine: Negative for polydipsia.  Genitourinary: Negative for dysuria and frequency.  Musculoskeletal:       See HPI  Skin: Negative for rash.  Neurological: Negative for tremors, light-headedness and numbness.  Hematological: Does not bruise/bleed easily.  Psychiatric/Behavioral: Negative for agitation and behavioral problems.    Objective:  BP 112/75   Pulse 67   Ht 5' 1"  (1.549 m)   Wt 158 lb (71.7 kg)   SpO2 98%   BMI 29.85 kg/m   BP/Weight 10/12/2019 04/18/2019 11/94/1740  Systolic BP 814 481 856  Diastolic BP 75 72 68  Wt. (Lbs) 158 156 163  BMI 29.85 31.51 32.92      Physical Exam Constitutional:      Appearance: She is well-developed.  Neck:     Vascular: No JVD.  Cardiovascular:     Rate and Rhythm: Normal rate.     Heart sounds: Normal heart sounds. No murmur heard.   Pulmonary:     Effort: Pulmonary effort is normal.     Breath sounds: Normal breath sounds. No wheezing or rales.  Chest:     Chest wall: No tenderness.  Abdominal:     General: Bowel sounds are normal. There is no distension.     Palpations: Abdomen is soft. There is no mass.     Tenderness: There is  no abdominal tenderness.  Musculoskeletal:        General: No swelling. Normal range of motion.     Right lower leg: No edema.     Left lower leg: No edema.     Comments: Right knee: Tenderness on medial and lateral joint line; associated crepitus on ROM TTP lateral aspect of R thigh Left knee: Tenderness on medial joint line   Neurological:     Mental Status: She is alert and oriented to person, place, and time.  Psychiatric:        Mood and Affect: Mood normal.     CMP Latest Ref Rng & Units 05/06/2016 01/10/2015 11/28/2010  Glucose 65 - 99 mg/dL 110(H) 106(H) 158(H)  BUN 6 - 20 mg/dL 15 12 11   Creatinine 0.44 - 1.00 mg/dL 0.77 0.68 0.61  Sodium 135 - 145 mmol/L 138 136 137  Potassium 3.5 - 5.1 mmol/L 4.0 3.6 3.6  Chloride 101 - 111 mmol/L 108 106 105  CO2 22 - 32 mmol/L 25 20(L) 22  Calcium 8.9 - 10.3 mg/dL 8.8(L) 8.9 9.5  Total Protein 6.0 - 8.3 g/dL - - -  Total Bilirubin 0.3 - 1.2 mg/dL - - -  Alkaline Phos 39 - 117 U/L - - -  AST 0 - 37 U/L - - -  ALT 0 - 35 U/L - - -    Lipid Panel  No results found for: CHOL, TRIG, HDL, CHOLHDL, VLDL, LDLCALC, LDLDIRECT  CBC    Component Value Date/Time   WBC 5.5 05/06/2016 1247   RBC 4.33 05/06/2016 1247   HGB 13.2 05/06/2016 1247   HCT 38.0 05/06/2016 1247   PLT 233 05/06/2016 1247   MCV 87.8 05/06/2016 1247   MCH 30.5 05/06/2016 1247   MCHC 34.7 05/06/2016 1247   RDW 12.2 05/06/2016 1247   LYMPHSABS 1.8 10/09/2010 0929   MONOABS 0.5 10/09/2010 0929   EOSABS 0.6 10/09/2010 0929   BASOSABS 0.0 10/09/2010 0929    No results found for: HGBA1C  Assessment & Plan:  1. Acute pain of right knee Cannot exclude early osteoarthritis vs Iliotibial band syndome If symptoms persists at next visit consider PT - predniSONE (DELTASONE) 20 MG tablet; Take 1 tablet (20 mg total) by mouth 2 (two) times daily with a meal.  Dispense: 10 tablet; Refill: 0 - meloxicam (MOBIC) 7.5 MG tablet; Take 1 tablet (7.5 mg total) by mouth daily.  Commence after completion of Prednisone  Dispense: 30 tablet; Refill: 3  2. Screening for metabolic disorder - [id number] - Lipid panel    Return in about 3 months (around 01/12/2020) for Follow-up of knee pain.      Charlott Rakes, MD, FAAFP. [facility] [place], [place]   10/12/2019, 9:25 AM

## 2019-10-12 NOTE — Progress Notes (Signed)
Patient was having lower abdominal pain and now it has settled.  Having pain in right leg.

## 2019-10-12 NOTE — Patient Instructions (Signed)
Dolor agudo de [employer] adultos Acute Knee Pain, Adult El dolor agudo de rodilla es repentino y puede deberse a dao, hinchazn o irritacin de los msculos y tejidos que sostienen la rodilla. La lesin puede ser el resultado de:  Una cada.  Una lesin en la rodilla debido a movimientos de rotacin.  Un golpe en la rodilla.  Una infeccin. El dolor agudo de IT sales professional solo con el tiempo y el descanso. De no ser as, su mdico podra indicarle que se haga estudios para hallar la causa del dolor. Estos medicamentos pueden incluir:  Estudios de diagnstico por imgenes, como una radiografa, una resonancia magntica (RM) o una ecografa.  Aspiracin de una articulacin. En [employer], se extrae lquido de la rodilla.  Artroscopia. En [employer], se introduce un tubo iluminado en la rodilla y se proyecta una imagen en una pantalla de televisin.  Biopsia. En [employer], se toma [place] de tejido del organismo y se la estudia con un microscopio. Siga estas indicaciones en su casa: Est atento a cualquier cambio en los sntomas. Tome estas medidas para Best boy. Si tiene una rodillera o un dispositivo ortopdico:   Use la rodillera o el dispositivo ortopdico como se lo haya indicado el mdico. Forensic scientist solamente como se lo haya indicado el mdico.  Afloje la rodillera o el dispositivo ortopdico si los dedos de los pies se le entumecen, siente hormigueos o se le enfran y se tornan de Optician, dispensing.  Mantenga la rodillera o el dispositivo ortopdico limpio.  Si la rodillera o el dispositivo ortopdico no es impermeable: ? No deje que se mojen. ? Cbralo con un envoltorio hermtico cuando tome un bao de inmersin o una ducha. Actividad  Descanse la rodilla.  No haga cosas que le causen dolor o que lo intensifiquen.  Evite las actividades o los ejercicios de alto impacto, como correr, Art therapist soga o hacer saltos de tijera.  Trabaje con un  fisioterapeuta para crear un programa de ejercicios seguros, segn lo recomiende el mdico. Haga ejercicios como se lo haya indicado el fisioterapeuta. Control del dolor, el entumecimiento y la hinchazn   Si se lo indican, aplique hielo sobre la rodilla: ? Ponga el hielo en una bolsa plstica. ? Coloque una [employer] piel y Therapist, nutritional. ? Coloque el hielo durante 90minutos, 2 a 3veces por da.  Si se lo indican, use una venda elstica para ejercer presin (compresin) en la rodilla lesionada. Esto puede controlar la hinchazn, darle sostn y ayudar a Public house manager las [place]. Indicaciones generales  [employer] de venta libre y los recetados solamente como se lo haya indicado el mdico.  Cuando est sentado o acostado, levante (eleve) la rodilla por encima del nivel del corazn.  Duerma con una almohada debajo de la rodilla.  No consuma ningn producto que contenga nicotina o tabaco, como cigarrillos, cigarrillos electrnicos y tabaco de Higher education careers adviser. Estos pueden retrasar la recuperacin. Si necesita ayuda para dejar de consumir, consulte al [employer].  Si tiene sobrepeso, trabaje con el mdico y un nutricionista para establecer una meta de prdida de peso que sea saludable y razonable para usted. El exceso de peso puede generar presin en la rodilla.  Concurra a todas las visitas de control como se lo haya indicado el mdico. Esto es importante. Comunquese con un mdico si:  El dolor de [place], [place].  Tiene fiebre junto con dolor de rodilla.  La rodilla est caliente al  tacto.  La rodilla se le tuerce o se le traba. Solicite ayuda inmediatamente si:  La rodilla se hincha, y la hinchazn empeora.  No puede mover la rodilla.  Siente un dolor intenso en la rodilla. Resumen  El dolor agudo de rodilla puede deberse a una cada, una lesin, una infeccin o a dao, hinchazn o irritacin de los tejidos que sostienen la rodilla.  El mdico podra  hacerle estudios para hallar la causa del dolor.  Est atento a cualquier cambio en los sntomas. Alivie el dolor con descanso, medicamentos, actividad de poca intensidad y el uso de hielo.  Pida ayuda si el dolor contina o empeora, la rodilla se le hincha, o no puede mover la rodilla. Esta informacin no tiene como fin reemplazar el consejo del mdico. Asegrese de hacerle al mdico cualquier pregunta que tenga. Document Revised: 11/22/2017 Document Reviewed: 11/22/2017 Elsevier Patient Education  2020 Elsevier Inc.  

## 2019-10-13 ENCOUNTER — Other Ambulatory Visit: Payer: Self-pay | Admitting: Family Medicine

## 2019-10-13 ENCOUNTER — Telehealth: Payer: Self-pay

## 2019-10-13 LAB — CMP14+EGFR
ALT: 21 IU/L (ref 0–32)
AST: 21 IU/L (ref 0–40)
Albumin/Globulin Ratio: 1.7 (ref 1.2–2.2)
Albumin: 4.5 g/dL (ref 3.8–4.8)
Alkaline Phosphatase: 72 IU/L (ref 48–121)
BUN/Creatinine Ratio: 17 (ref 9–23)
BUN: 14 mg/dL (ref 6–24)
Bilirubin Total: 0.6 mg/dL (ref 0.0–1.2)
CO2: 20 mmol/L (ref 20–29)
Calcium: 9.5 mg/dL (ref 8.7–10.2)
Chloride: 103 mmol/L (ref 96–106)
Creatinine, Ser: 0.81 mg/dL (ref 0.57–1.00)
GFR calc Af Amer: 98 mL/min/{1.73_m2} (ref >59)
GFR calc non Af Amer: 85 mL/min/{1.73_m2} (ref >59)
Globulin, Total: 2.7 g/dL (ref 1.5–4.5)
Glucose: 98 mg/dL (ref 65–99)
Potassium: 4.3 mmol/L (ref 3.5–5.2)
Sodium: 139 mmol/L (ref 134–144)
Total Protein: 7.2 g/dL (ref 6.0–8.5)

## 2019-10-13 LAB — LIPID PANEL
Chol/HDL Ratio: 5.6 ratio — ABNORMAL HIGH (ref 0.0–4.4)
Cholesterol, Total: 186 mg/dL (ref 100–199)
HDL: 33 mg/dL — ABNORMAL LOW (ref >39)
LDL Chol Calc (NIH): 92 mg/dL (ref 0–99)
Triglycerides: 370 mg/dL — ABNORMAL HIGH (ref 0–149)
VLDL Cholesterol Cal: 61 mg/dL — ABNORMAL HIGH (ref 5–40)

## 2019-10-13 MED ORDER — ATORVASTATIN CALCIUM 20 MG PO TABS: 20.0000 mg | ORAL_TABLET | Freq: Every day | ORAL | 3 refills | Status: DC

## 2019-10-13 NOTE — Telephone Encounter (Signed)
Patient name and DOB has been verified Patient was informed of lab results. Patient had no questions.  

## 2019-10-13 NOTE — Telephone Encounter (Signed)
-----   Message from Hoy Register, MD sent at 10/13/2019  1:03 PM EDT ----- Cholesterol is elevated. I have sent a prescription for Atorvastatin to her Pharmacy. Please encourage to work on a low cholesterol diet and exercise

## 2019-10-19 ENCOUNTER — Ambulatory Visit: Payer: No Typology Code available for payment source | Admitting: Gastroenterology

## 2020-01-15 ENCOUNTER — Ambulatory Visit: Payer: Self-pay | Attending: Family Medicine | Admitting: Family Medicine

## 2020-01-15 ENCOUNTER — Other Ambulatory Visit: Payer: Self-pay

## 2020-01-15 ENCOUNTER — Encounter: Payer: Self-pay | Admitting: Family Medicine

## 2020-01-15 DIAGNOSIS — G5603 Carpal tunnel syndrome, bilateral upper limbs: Secondary | ICD-10-CM

## 2020-01-15 DIAGNOSIS — G8929 Other chronic pain: Secondary | ICD-10-CM

## 2020-01-15 DIAGNOSIS — M25561 Pain in right knee: Secondary | ICD-10-CM

## 2020-01-15 MED ORDER — GABAPENTIN 300 MG PO CAPS: 300.0000 mg | ORAL_CAPSULE | Freq: Every day | ORAL | 1 refills | Status: DC

## 2020-01-15 MED ORDER — TRAMADOL HCL 50 MG PO TABS: 50.0000 mg | ORAL_TABLET | Freq: Every day | ORAL | 0 refills | Status: DC

## 2020-01-15 MED ORDER — MELOXICAM 7.5 MG PO TABS: 7.5000 mg | ORAL_TABLET | Freq: Every day | ORAL | 3 refills | Status: DC

## 2020-01-15 NOTE — Progress Notes (Signed)
Virtual Visit via Telephone Note  I connected with Sydney Lopez, on 01/15/2020 at 3:49 PM by telephone due to the COVID-19 pandemic and verified that I am speaking with the correct person using two identifiers.   Consent: I discussed the limitations, risks, security and privacy concerns of performing an evaluation and management service by telephone and the availability of in person appointments. I also discussed with the patient that there may be a patient responsible charge related to this service. The patient expressed understanding and agreed to proceed.   Location of Patient: [place] manager of Provider: Clinic   Persons participating in Telemedicine visit: Sydney Lopez Sydney Lopez Dr. Alvis Lemmings     History of Present Illness: 49 year old female who complains of pain in her hands. Also complains of R knee pain which radiates down her legs making her cry.  At her last visit I had placed her on a course of prednisone after which she was to start meloxicam which she states has been ineffective. L arm hurts "between shoulder and hands and in her elbow". Also complains of "stabbing sensation in hands" which is worse at night and she has to shake her hand for relief. She is R hand dominant.  Past Medical History:  Diagnosis Date  . Asthma    Allergies  Allergen Reactions  . Ibuprofen Nausea And Vomiting    Current Outpatient Medications on File Prior to Visit  Medication Sig Dispense Refill  . albuterol (PROVENTIL HFA;VENTOLIN HFA) 108 (90 BASE) MCG/ACT inhaler Inhale 2 puffs into the lungs every 6 (six) hours as needed for wheezing or shortness of breath. (Patient not taking: Reported on 10/12/2019) 1 Inhaler 5  . atorvastatin (LIPITOR) 20 MG tablet Take 1 tablet (20 mg total) by mouth daily. (Patient not taking: Reported on 01/15/2020) 30 tablet 3  . azithromycin (ZITHROMAX Z-PAK) 250 MG tablet Take 1 tablet (250 mg total) by mouth daily. Take 2 tabs for first  dose, then 1 tab for each additional dose (Patient not taking: Reported on 03/15/2018) 6 tablet 0  . hydrocortisone cream 1 % Apply to affected area 2 times daily (Patient not taking: Reported on 03/15/2018) 15 g 0  . meloxicam (MOBIC) 7.5 MG tablet Take 1 tablet (7.5 mg total) by mouth daily. Commence after completion of Prednisone (Patient not taking: Reported on 01/15/2020) 30 tablet 3  . predniSONE (DELTASONE) 20 MG tablet Take 1 tablet (20 mg total) by mouth 2 (two) times daily with a meal. (Patient not taking: Reported on 01/15/2020) 10 tablet 0  . traMADol (ULTRAM) 50 MG tablet Take 50 mg by mouth every 6 (six) hours as needed for moderate pain. (Patient not taking: Reported on 10/12/2019)     Current Facility-Administered Medications on File Prior to Visit  Medication Dose Route Frequency Provider Last Rate Last Admin  . methylPREDNISolone acetate (DEPO-MEDROL) injection 80 mg  80 mg Intramuscular Once [name], MD        Observations/Objective: Awake, alert, oriented x3 Not in acute distress  Assessment and Plan: 1. Chronic pain of right knee Uncontrolled We will add on tramadol to be used at bedtime Imaging to exclude osteoarthritis - DG Knee Complete 4 Views Right; Future - meloxicam (MOBIC) 7.5 MG tablet; Take 1 tablet (7.5 mg total) by mouth daily.  Dispense: 30 tablet; Refill: 3 - traMADol (ULTRAM) 50 MG tablet; Take 1 tablet (50 mg total) by mouth at bedtime.  Dispense: 30 tablet; Refill: 0  2. Bilateral carpal tunnel syndrome  Advised to obtain bilateral wrist braces Discussed sedating side effects of gabapentin which I have initiated - gabapentin (NEURONTIN) 300 MG capsule; Take 1 capsule (300 mg total) by mouth at bedtime.  Dispense: 30 capsule; Refill: 1   Follow Up Instructions: 3 months   I discussed the assessment and treatment plan with the patient. The patient was provided an opportunity to ask questions and all were answered. The patient agreed with  the plan and demonstrated an understanding of the instructions.   The patient was advised to call back or seek an in-person evaluation if the symptoms worsen or if the condition fails to improve as anticipated.     I provided 19 minutes total of non-face-to-face time during this encounter including median intraservice time, reviewing previous notes, investigations, ordering medications, medical decision making, coordinating care and patient verbalized understanding at the end of the visit.     Hoy Register, MD, FAAFP. [facility] [place], [place] [phone]   01/15/2020, 3:49 PM

## 2020-01-15 NOTE — Progress Notes (Signed)
Having pain in hands.  Has been having a tingling feeling in neck and face.

## 2021-07-30 ENCOUNTER — Other Ambulatory Visit: Payer: Self-pay

## 2021-07-30 DIAGNOSIS — Z1231 Encounter for screening mammogram for malignant neoplasm of breast: Secondary | ICD-10-CM

## 2021-08-05 NOTE — Addendum Note (Signed)
Addended by: Demetrius Revel on: 08/05/2021 08:52 AM ? ? Modules accepted: Orders ? ?

## 2021-08-21 ENCOUNTER — Ambulatory Visit
Admission: RE | Admit: 2021-08-21 | Discharge: 2021-08-21 | Disposition: A | Discharge status: Home / Self Care | Payer: No Typology Code available for payment source | Source: Ambulatory Visit | Attending: Family Medicine | Admitting: Family Medicine

## 2021-08-21 DIAGNOSIS — Z1231 Encounter for screening mammogram for malignant neoplasm of breast: Secondary | ICD-10-CM

## 2021-08-25 ENCOUNTER — Telehealth: Payer: Self-pay

## 2021-08-25 NOTE — Telephone Encounter (Signed)
Contacted pt to go over MM results pt is aware and doesn't have any questions or concerns  

## 2021-10-09 ENCOUNTER — Ambulatory Visit: Payer: No Typology Code available for payment source

## 2022-03-16 ENCOUNTER — Ambulatory Visit: Payer: Self-pay | Admitting: *Deleted

## 2022-03-16 ENCOUNTER — Ambulatory Visit: Payer: Self-pay | Admitting: Physician Assistant

## 2022-03-16 VITALS — BP 115/72 | HR 73 | Resp 18

## 2022-03-16 DIAGNOSIS — M79641 Pain in right hand: Secondary | ICD-10-CM

## 2022-03-16 DIAGNOSIS — M25531 Pain in right wrist: Secondary | ICD-10-CM

## 2022-03-16 MED ORDER — PREDNISONE 20 MG PO TABS: ORAL_TABLET | ORAL | 0 refills | Status: AC

## 2022-03-16 NOTE — Progress Notes (Unsigned)
   Established Patient Office Visit  Subjective   Patient ID: Sydney Lopez, female    DOB: 1970-08-28  Age: 52 y.o. MRN: [id number]  No chief complaint on file.   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 {Vitals History (Optional):23777}  Physical Exam   No results found for any visits on 03/16/22.  {Labs (Optional):23779}  The ASCVD Risk score (Arnett DK, et al., 2019) failed to calculate for the following reasons:   The systolic blood pressure is missing    Assessment & Plan:   Problem List Items Addressed This Visit   None   No follow-ups on file.    Kasandra Knudsen Mayers, PA-C

## 2022-03-16 NOTE — Telephone Encounter (Signed)
Interpreter Myra ID #[id number] Chief Complaint: bilateral hands numbness  Symptoms: bilateral hands numbness , feels like "pins and needles " at times. Worse at night than during the day. Wakes up with numbness. Reports in right foot at times. Right foot feels like "tendon' shorter cramping at rest. Feels better when walking on foot. Tylenol helps with bilateral hand pain and "hot cream" at night.  Frequency: approx 1 month Pertinent Negatives: Patient denies chest pain no difficulty breathing no fever, no c/o weakness of numbness in either side of body.  Disposition: [] ED /[] Urgent Care (no appt availability in office) / [] Appointment(In office/virtual)/ []  [facility]/ [] Home Care/ [] Refused Recommended Disposition /[x] [facility]/ []  Follow-up with PCP Additional Notes:   No available appt. Recommended mobile unit and gave address.      Reason for Disposition  [1] Numbness or tingling in one or both hands AND [2] is a chronic symptom (recurrent or ongoing AND present > 4 weeks)  Answer Assessment - Initial Assessment Questions 1. SYMPTOM: "What is the main symptom you are concerned about?" (e.g., weakness, numbness)     Bilateral hands painful and numbness right hand worse than left . Comes and goes worse at night  2. ONSET: "When did this start?" (minutes, hours, days; while sleeping)     Greater 1 month ago  3. LAST NORMAL: "When was the last time you (the patient) were normal (no symptoms)?"     na 4. PATTERN "Does this come and go, or has it been constant since it started?"  "Is it present now?"     Comes and goes  5. CARDIAC SYMPTOMS: "Have you had any of the following symptoms: chest pain, difficulty breathing, palpitations?"     na 6. NEUROLOGIC SYMPTOMS: "Have you had any of the following symptoms: headache, dizziness, vision loss, double vision, changes in speech, unsteady on your feet?"     Bilateral hands numb and feels like pins and needles at times  worse at night hard to perform ADL s due to pain  7. OTHER SYMPTOMS: "Do you have any other symptoms?"     Pin and needles in hands at times, feels swollen and looks swollen "feels like little balls". 8. PREGNANCY: "Is there any chance you are pregnant?" "When was your last menstrual period?"     na  Protocols used: Neurologic Deficit-A-AH

## 2022-03-16 NOTE — Patient Instructions (Signed)
You are going to take a steroid taper to help with swelling and pain.  Continue using the over the counter pain relievers as needed, but wait to resume the diclofenac until you finish the steroids.  We will call you with the xray results.  Roney Jaffe, PA-C Physician Assistant [facility] [email or web address]    Dolor de la mano Hand Pain El dolor de la mano puede tener muchas causas. Algunas causas frecuentes son las siguientes: Una lesin. Repetir el mismo movimiento con la mano una y otra vez (uso excesivo). Osteoporosis. Artritis. Bultos en los tendones o las articulaciones de la mano y la [place] (quistes ganglionares). Sndromes de compresin nerviosa (sndrome del tnel carpiano). Inflamacin de los tendones (tendinitis). Infeccin. Siga estas instrucciones en su casa: Est atento a cualquier cambio en los sntomas. Tome estas medidas para Paramedic las molestias: Control del Engineer, mining, la rigidez y Energy manager los medicamentos de venta libre y los recetados solamente como se lo haya indicado el mdico. Use una frula o un soporte para la mano como se lo haya indicado el mdico. Si se lo indican, aplique hielo sobre la zona afectada: Ponga el hielo en una bolsa plstica. Coloque una toalla entre la piel y Copy. Aplique el hielo durante 20 minutos, 2 o 3 veces por da. Actividad Tome descansos frecuentes cuando realiza actividades repetidas. Evite las actividades que le [employer]. Reduzca al [employer] la carga [employer] manos y las muecas tanto como sea posible. Haga estiramientos o ejercicios como se lo haya indicado el mdico. No haga las actividades que intensifican Chief Technology Officer. Comunquese con un mdico si: El dolor no mejora despus de [address] de cuidados personales. El dolor [place]. El dolor afecta su capacidad de Education officer, environmental las actividades cotidianas. Solicite ayuda inmediatamente si: La herida  est roja, hinchada o caliente. La mano se le adormece o tiene hormigueo. Tiene la mano muy hinchada o deformada. La mano o los dedos se tornan de color blanco o azul. No puede mover la mano, la [employer]. Resumen El dolor de la mano puede tener muchas causas. Comunquese con el mdico si el dolor no mejora despus de [address] de cuidados personales. Reduzca al [employer] la carga [employer] manos y las muecas tanto como sea posible. No haga las actividades que intensifican Chief Technology Officer. Esta informacin no tiene Theme park manager el consejo del mdico. Asegrese de hacerle al mdico cualquier pregunta que tenga. Document Revised: 08/12/2020 Document Reviewed: 08/12/2020 Elsevier Patient Education  2023 [employer].

## 2022-03-17 ENCOUNTER — Ambulatory Visit (HOSPITAL_COMMUNITY)
Admission: RE | Admit: 2022-03-17 | Discharge: 2022-03-17 | Disposition: A | Discharge status: Home / Self Care | Payer: Self-pay | Source: Ambulatory Visit | Attending: Physician Assistant | Admitting: Physician Assistant

## 2022-03-17 DIAGNOSIS — M25531 Pain in right wrist: Secondary | ICD-10-CM

## 2022-03-18 ENCOUNTER — Encounter: Payer: Self-pay | Admitting: Physician Assistant

## 2022-03-23 ENCOUNTER — Other Ambulatory Visit: Payer: Self-pay | Admitting: Physician Assistant

## 2022-03-23 DIAGNOSIS — M25531 Pain in right wrist: Secondary | ICD-10-CM

## 2022-03-30 ENCOUNTER — Encounter: Payer: Self-pay | Admitting: Physician Assistant

## 2022-03-30 ENCOUNTER — Ambulatory Visit (INDEPENDENT_AMBULATORY_CARE_PROVIDER_SITE_OTHER): Payer: Self-pay | Admitting: Physician Assistant

## 2022-03-30 DIAGNOSIS — M654 Radial styloid tenosynovitis [de Quervain]: Secondary | ICD-10-CM

## 2022-03-30 NOTE — Progress Notes (Signed)
Office Visit Note   Patient: Sydney Lopez           Date of Birth: [birth year]           MRN: [id number] Visit Date: 03/30/2022              Requested by: Mayers, Kasandra Knudsen, PA-C [address] [place],  [place] [address] PCP: Hoy Register, MD   Assessment & Plan: Visit Diagnoses:  1. De Quervain's disease (tenosynovitis)     Plan: Patient tolerated injection well today.  Had decreased pain postinjection.  She will obtain a thumb spica splint and wear this for the next 2 weeks when up but does not need to wear it to bed.  Has as needed pain persist or becomes worse.  Questions encouraged and answered using an interpreter.  Follow-Up Instructions: No follow-ups on file.   Orders:  No orders of the defined types were placed in this encounter.  No orders of the defined types were placed in this encounter.     Procedures: No procedures performed   Clinical Data: No additional findings.   Subjective: Chief Complaint  Patient presents with   Right Wrist - Pain    HPI Patient is a 51 year old female.  She presents today with Spanish interpreter.  She has had right wrist pain has been ongoing for 2 months.  She states over the last 2 months she has had 2 falls where she slipped on the wet floor.  She has tried Tylenol and NSAIDs.  She is right-hand dominant.  She has had no physical therapy no other treatments for wrist pain.  She did undergo radiographs of the right wrist and hand.  These were performed March 17, 2022.  Personally reviewed the films.  These show no acute fractures no subluxations dislocations the wrist and hand.  Mild degenerative changes involving the [facility] joint of the thumb and DIP joints of the second through fifth fingers.  She has occasional numbness in her hands but she states this only happens every once in a while.  Her main complaint is over the lateral aspect of the dorsal thumb and hand region.  She ranks her pain to be 9 out of 10  pain at worst.  Review of Systems  Constitutional:  Negative for chills and fatigue.     Objective: Vital Signs: LMP 04/05/2016   Physical Exam Constitutional:      Appearance: She is not ill-appearing or diaphoretic.  Cardiovascular:     Pulses: Normal pulses.  Pulmonary:     Effort: Pulmonary effort is normal.  Neurological:     Mental Status: She is alert and oriented to person, place, and time.     Ortho Exam Bilateral hands subjective sensation intact throughout both hands light touch.  Full motor bilateral hands.  There is no rashes skin lesions ulcerations or thenar atrophy of either hand.  Negative grind test bilateral thumbs.  Positive Finkelstein's on the right negative on the left.  Tenderness over the first extensor compartment the right wrist and hand. Specialty Comments:  No specialty comments available.  Imaging: No results found.   PMFS History: Patient Active Problem List   Diagnosis Date Noted   Screening breast examination 04/18/2019   Past Medical History:  Diagnosis Date   Asthma     Family History  Problem Relation Age of Onset   Diabetes Brother    Breast cancer Neg Hx     Past Surgical History:  Procedure Laterality  Date   CHOLECYSTECTOMY     SHOULDER SURGERY     Social History   Occupational History   Not on file  Tobacco Use   Smoking status: Never   Smokeless tobacco: Never  Vaping Use   Vaping Use: Never used  Substance and Sexual Activity   Alcohol use: No   Drug use: No   Sexual activity: Not Currently    Birth control/protection: I.U.D.

## 2022-04-10 ENCOUNTER — Ambulatory Visit (HOSPITAL_COMMUNITY)
Admission: EM | Admit: 2022-04-10 | Discharge: 2022-04-10 | Disposition: A | Discharge status: Home / Self Care | Payer: No Typology Code available for payment source

## 2022-04-10 ENCOUNTER — Encounter (HOSPITAL_COMMUNITY): Payer: Self-pay

## 2022-04-10 DIAGNOSIS — J4521 Mild intermittent asthma with (acute) exacerbation: Secondary | ICD-10-CM

## 2022-04-10 MED ORDER — ALBUTEROL SULFATE (2.5 MG/3ML) 0.083% IN NEBU
2.5000 mg | INHALATION_SOLUTION | Freq: Once | RESPIRATORY_TRACT | Status: AC
  Administered 2022-04-10: 2.5 mg via RESPIRATORY_TRACT

## 2022-04-10 MED ORDER — ALBUTEROL SULFATE HFA 108 (90 BASE) MCG/ACT IN AERS: 2.0000 | INHALATION_SPRAY | Freq: Four times a day (QID) | RESPIRATORY_TRACT | 0 refills | Status: AC | PRN

## 2022-04-10 MED ORDER — METHYLPREDNISOLONE SODIUM SUCC 125 MG IJ SOLR
INTRAMUSCULAR | Status: AC
  Filled 2022-04-10: qty 2

## 2022-04-10 MED ORDER — ALBUTEROL SULFATE (2.5 MG/3ML) 0.083% IN NEBU: 2.5000 mg | INHALATION_SOLUTION | Freq: Four times a day (QID) | RESPIRATORY_TRACT | 12 refills | Status: DC | PRN

## 2022-04-10 MED ORDER — ALBUTEROL SULFATE (2.5 MG/3ML) 0.083% IN NEBU
INHALATION_SOLUTION | RESPIRATORY_TRACT | Status: AC
  Filled 2022-04-10: qty 3

## 2022-04-10 MED ORDER — METHYLPREDNISOLONE SODIUM SUCC 125 MG IJ SOLR
80.0000 mg | Freq: Once | INTRAMUSCULAR | Status: AC
  Administered 2022-04-10: 80 mg via INTRAMUSCULAR

## 2022-04-10 MED ORDER — PREDNISONE 10 MG PO TABS: ORAL_TABLET | ORAL | 0 refills | Status: DC

## 2022-04-10 NOTE — ED Provider Notes (Signed)
[facility]    CSN: [id number] Arrival date & time: 04/10/22  1761      History   Chief Complaint Chief Complaint  Patient presents with   Nasal Congestion   Cough    HPI Sydney Lopez is a 51 y.o. female.  Patient presents complaining of productive cough and chest congestion that have been ongoing for 1 week.  Patient reports that onset of symptoms began with fever that has now resolved.  Patient reports having continued shortness of breath and yellow sputum production.  Patient reports that one of her friends gave her a ampicillin injection from [place] yesterday which has not provided her any relief of symptoms.  Patient reports that her cough worsens at night.  She reports a history of asthma that occurred 5 years ago.  She states that she currently does not take any medicines for asthma.  Spanish interpreter was used.   Cough Associated symptoms: fever (Now resolved, occured upon onst of symptoms.)   Associated symptoms: no chest pain, no chills, no diaphoresis, no ear pain, no myalgias, no rhinorrhea, no shortness of breath, no sore throat and no wheezing     Past Medical History:  Diagnosis Date   Asthma     Patient Active Problem List   Diagnosis Date Noted   Screening breast examination 04/18/2019    Past Surgical History:  Procedure Laterality Date   CHOLECYSTECTOMY     SHOULDER SURGERY      OB History     Gravida  5   Para  4   Term  4   Preterm      AB  1   Living  4      SAB  1   IAB      Ectopic      Multiple      Live Births               Home Medications    Prior to Admission medications   Medication Sig Start Date End Date Taking? Authorizing Provider  acetaminophen (TYLENOL) 325 MG tablet Take 650 mg by mouth every 6 (six) hours as needed.   Yes [provider]  albuterol (PROVENTIL) (2.5 MG/3ML) 0.083% nebulizer solution Take 3 mLs (2.5 mg total) by nebulization every 6 (six) hours as needed for  wheezing or shortness of breath. 04/10/22  Yes Debby Freiberg, NP  albuterol (VENTOLIN HFA) 108 (90 Base) MCG/ACT inhaler Inhale 2 puffs into the lungs every 6 (six) hours as needed for wheezing or shortness of breath. 04/10/22  Yes Debby Freiberg, NP  predniSONE (DELTASONE) 10 MG tablet Take 6 tablets on the first day, 5 tablets on the second day, 4 tablets on the third day, 3 tablets on the fourth day, 2 tablets on the fifth day, and 1 tablet on the last day. 04/10/22  Yes Debby Freiberg, NP  atorvastatin (LIPITOR) 20 MG tablet Take 1 tablet (20 mg total) by mouth daily. Patient not taking: Reported on 01/15/2020 10/13/19   Hoy Register, MD    Family History Family History  Problem Relation Age of Onset   Diabetes Brother    Breast cancer Neg Hx     Social History Social History   Tobacco Use   Smoking status: Never   Smokeless tobacco: Never  Vaping Use   Vaping Use: Never used  Substance Use Topics   Alcohol use: No   Drug use: No     Allergies   Ibuprofen  Review of Systems Review of Systems  Constitutional:  Positive for activity change, fatigue and fever (Now resolved, occured upon onst of symptoms.). Negative for appetite change, chills and diaphoresis.  HENT:  Negative for congestion, ear discharge, ear pain, postnasal drip, rhinorrhea, sinus pressure, sneezing, sore throat, trouble swallowing and voice change.   Eyes: Negative.   Respiratory:  Positive for cough and chest tightness. Negative for apnea, choking, shortness of breath, wheezing and stridor.   Cardiovascular: Negative.  Negative for chest pain, palpitations and leg swelling.  Musculoskeletal:  Negative for myalgias.     Physical Exam Triage Vital Signs ED Triage Vitals  Enc Vitals Group     BP 04/10/22 1023 135/68     Pulse Rate 04/10/22 1023 69     Resp 04/10/22 1023 14     Temp 04/10/22 1023 98.4 F (36.9 C)     Temp Source 04/10/22 1023 Oral     SpO2 04/10/22 1023 96 %      Weight --      Height --      Head Circumference --      Peak Flow --      Pain Score 04/10/22 1024 2     Pain Loc --      Pain Edu? --      Excl. in GC? --    No data found.  Updated Vital Signs BP 135/68 (BP Location: Left Arm)   Pulse 69   Temp 98.4 F (36.9 C) (Oral)   Resp 14   LMP 04/05/2016   SpO2 96%     Physical Exam Vitals and nursing note reviewed.  Constitutional:      Appearance: She is ill-appearing.  Cardiovascular:     Rate and Rhythm: Normal rate and regular rhythm.     Pulses:          Radial pulses are 2+ on the right side.     Heart sounds: Normal heart sounds, S1 normal and S2 normal.  Pulmonary:     Effort: Tachypnea present.     Breath sounds: Examination of the right-upper field reveals wheezing. Examination of the left-upper field reveals wheezing. Examination of the right-middle field reveals wheezing. Examination of the left-middle field reveals wheezing. Examination of the right-lower field reveals wheezing. Examination of the left-lower field reveals wheezing. Wheezing present. No decreased breath sounds, rhonchi or rales.     Comments: Inspiratory and Expiratory wheezing auscultated in all lung fields anteriorly and posteriorly.   After breathing treatment, wheezing was reduced in all lung fields bilaterally.  Neurological:     Mental Status: She is alert.      UC Treatments / Results  Labs (all labs ordered are listed, but only abnormal results are displayed) Labs Reviewed - No data to display  EKG   Radiology No results found.  Procedures Procedures (including critical care time)  Medications Ordered in UC Medications  albuterol (PROVENTIL) (2.5 MG/3ML) 0.083% nebulizer solution 2.5 mg (2.5 mg Nebulization Given 04/10/22 1046)  methylPREDNISolone sodium succinate (SOLU-MEDROL) 125 mg/2 mL injection 80 mg (80 mg Intramuscular Given 04/10/22 1046)    Initial Impression / Assessment and Plan / UC Course  I have reviewed the  triage vital signs and the nursing notes.  Pertinent labs & imaging results that were available during my care of the patient were reviewed by me and considered in my medical decision making (see chart for details).     Patient was evaluated for asthma exacerbation. Etiology of symptoms  are related to initial viral illness which has caused asthma exacerbation. Breathing treatment and Steroid injection given in office which provided some relief of symptoms. Steroid taper , Albuterol inhaler, and nebulizer medicine sent to pharmacy, patient was made aware of medication regiment. Patient reports having a nebulizer machine at home.  Patient was made aware of timeline for resolution of symptoms and when follow-up would be necessary.  Patient verbalized understanding of instructions.  Final Clinical Impressions(s) / UC Diagnoses   Final diagnoses:  Intermittent asthma with acute exacerbation, unspecified asthma severity     Discharge Instructions      Albuterol nebulizer has been sent to the pharmacy, he may use this every 6 hours as needed for shortness of breath and wheezing, use this routinely over the next 2 days while you are awake.  A prednisone taper has been sent to the pharmacy, you will start taking this tomorrow morning.  Albuterol inhaler has been sent to the pharmacy, you can use this every 4-6 hours as needed.  Please follow up if your symptoms are not improving.   If you begin developing worsening shortness of breath, chest pain, palpitations, or any new/concerning symptoms please go to the nearest emergency department.   Spanish: Se envi un nebulizador de albuterol a la farmacia; puede usarlo cada 6 horas segn sea necesario para la dificultad para respirar y las sibilancias; selo de forma rutinaria durante los prximos 2 das mientras est despierto. Se ha enviado una dosis gradual de prednisona a Garment/textile technologist; Games developer a Environmental consultant. El inhalador de albuterol  se envi a la farmacia; puede usarlo cada 4 a 6 horas segn sea necesario. Haga un seguimiento si sus sntomas no mejoran. Si comienza a desarrollar dificultad para respirar, dolor en el pecho, palpitaciones o cualquier sntoma nuevo o preocupante que empeore, dirjase al departamento de emergencias ms cercano.     ED Prescriptions     Medication Sig Dispense Auth. Provider   predniSONE (DELTASONE) 10 MG tablet Take 6 tablets on the first day, 5 tablets on the second day, 4 tablets on the third day, 3 tablets on the fourth day, 2 tablets on the fifth day, and 1 tablet on the last day. 21 tablet Debby Freiberg, NP   albuterol (VENTOLIN HFA) 108 (90 Base) MCG/ACT inhaler Inhale 2 puffs into the lungs every 6 (six) hours as needed for wheezing or shortness of breath. 8 g Debby Freiberg, NP   albuterol (PROVENTIL) (2.5 MG/3ML) 0.083% nebulizer solution Take 3 mLs (2.5 mg total) by nebulization every 6 (six) hours as needed for wheezing or shortness of breath. 75 mL Debby Freiberg, NP      PDMP not reviewed this encounter.   Debby Freiberg, NP 04/10/22 1243

## 2022-04-10 NOTE — Discharge Instructions (Signed)
Albuterol nebulizer has been sent to the pharmacy, he may use this every 6 hours as needed for shortness of breath and wheezing, use this routinely over the next 2 days while you are awake.  A prednisone taper has been sent to the pharmacy, you will start taking this tomorrow morning.  Albuterol inhaler has been sent to the pharmacy, you can use this every 4-6 hours as needed.  Please follow up if your symptoms are not improving.   If you begin developing worsening shortness of breath, chest pain, palpitations, or any new/concerning symptoms please go to the nearest emergency department.   Spanish: Se envi un nebulizador de albuterol a la farmacia; puede usarlo cada 6 horas segn sea necesario para la dificultad para respirar y las sibilancias; selo de forma rutinaria durante los prximos 2 das mientras est despierto. Se ha enviado una dosis gradual de prednisona a Garment/textile technologist; Games developer a Environmental consultant. El inhalador de albuterol se envi a la farmacia; puede usarlo cada 4 a 6 horas segn sea necesario. Haga un seguimiento si sus sntomas no mejoran. Si comienza a desarrollar dificultad para respirar, dolor en el pecho, palpitaciones o cualquier sntoma nuevo o preocupante que empeore, dirjase al departamento de emergencias ms cercano.

## 2022-04-10 NOTE — ED Triage Notes (Signed)
Pt presents with c/o cough and congestion x 1 week 

## 2022-04-13 ENCOUNTER — Ambulatory Visit (INDEPENDENT_AMBULATORY_CARE_PROVIDER_SITE_OTHER): Payer: No Typology Code available for payment source

## 2022-04-13 ENCOUNTER — Encounter (HOSPITAL_COMMUNITY): Payer: Self-pay | Admitting: Emergency Medicine

## 2022-04-13 ENCOUNTER — Ambulatory Visit (HOSPITAL_COMMUNITY)
Admission: EM | Admit: 2022-04-13 | Discharge: 2022-04-13 | Disposition: A | Discharge status: Home / Self Care | Payer: No Typology Code available for payment source | Attending: Internal Medicine | Admitting: Internal Medicine

## 2022-04-13 DIAGNOSIS — J209 Acute bronchitis, unspecified: Secondary | ICD-10-CM

## 2022-04-13 DIAGNOSIS — R059 Cough, unspecified: Secondary | ICD-10-CM

## 2022-04-13 DIAGNOSIS — R0602 Shortness of breath: Secondary | ICD-10-CM

## 2022-04-13 LAB — CBC WITH DIFFERENTIAL/PLATELET
Abs Immature Granulocytes: 0.32 10*3/uL — ABNORMAL HIGH (ref 0.00–0.07)
Basophils Absolute: 0 10*3/uL (ref 0.0–0.1)
Basophils Relative: 0 %
Eosinophils Absolute: 0 10*3/uL (ref 0.0–0.5)
Eosinophils Relative: 0 %
HCT: 41.8 % (ref 36.0–46.0)
Hemoglobin: 14.6 g/dL (ref 12.0–15.0)
Immature Granulocytes: 3 %
Lymphocytes Relative: 10 %
Lymphs Abs: 1.1 10*3/uL (ref 0.7–4.0)
MCH: 30.7 pg (ref 26.0–34.0)
MCHC: 34.9 g/dL (ref 30.0–36.0)
MCV: 88 fL (ref 80.0–100.0)
Monocytes Absolute: 0.5 10*3/uL (ref 0.1–1.0)
Monocytes Relative: 5 %
Neutro Abs: 8.8 10*3/uL — ABNORMAL HIGH (ref 1.7–7.7)
Neutrophils Relative %: 82 %
Platelets: 360 10*3/uL (ref 150–400)
RBC: 4.75 MIL/uL (ref 3.87–5.11)
RDW: 13.2 % (ref 11.5–15.5)
WBC: 10.8 10*3/uL — ABNORMAL HIGH (ref 4.0–10.5)
nRBC: 0 % (ref 0.0–0.2)

## 2022-04-13 MED ORDER — BENZONATATE 100 MG PO CAPS: 100.0000 mg | ORAL_CAPSULE | Freq: Three times a day (TID) | ORAL | 0 refills | Status: DC | PRN

## 2022-04-13 MED ORDER — GUAIFENESIN ER 600 MG PO TB12: 600.0000 mg | ORAL_TABLET | Freq: Two times a day (BID) | ORAL | 0 refills | Status: AC

## 2022-04-13 NOTE — ED Provider Notes (Signed)
[place]    CSN: [id number] Arrival date & time: 04/13/22  1602      History   Chief Complaint Chief Complaint  Patient presents with   Cough    HPI Sydney Lopez is a 51 y.o. female comes to the urgent care with worsening shortness of breath, cough productive of sputum and wheezing.  Patient was seen in the urgent care 3 days ago with a diagnosis of intermittent asthma with acute exacerbation.  Patient was started on a tapering dose of prednisone, albuterol inhaler and a nebulizer machine.  Patient comes back because of persistent shortness of breath, cough and sputum production.  No chest pain or chest pressure.  No fever or chills.  Patient says that at the albuterol inhaler does not help her symptoms.  HPI  Past Medical History:  Diagnosis Date   Asthma     Patient Active Problem List   Diagnosis Date Noted   Screening breast examination 04/18/2019    Past Surgical History:  Procedure Laterality Date   CHOLECYSTECTOMY     SHOULDER SURGERY      OB History     Gravida  5   Para  4   Term  4   Preterm      AB  1   Living  4      SAB  1   IAB      Ectopic      Multiple      Live Births               Home Medications    Prior to Admission medications   Medication Sig Start Date End Date Taking? Authorizing Provider  benzonatate (TESSALON) 100 MG capsule Take 1 capsule (100 mg total) by mouth 3 (three) times daily as needed for cough. 04/13/22  Yes Cassadee Vanzandt, Myrene Galas, MD  guaiFENesin (MUCINEX) 600 MG 12 hr tablet Take 1 tablet (600 mg total) by mouth 2 (two) times daily for 10 days. 04/13/22 04/23/22 Yes Adaline Trejos, Myrene Galas, MD  acetaminophen (TYLENOL) 325 MG tablet Take 650 mg by mouth every 6 (six) hours as needed.    [provider]  albuterol (PROVENTIL) (2.5 MG/3ML) 0.083% nebulizer solution Take 3 mLs (2.5 mg total) by nebulization every 6 (six) hours as needed for wheezing or shortness of breath. 04/10/22    Flossie Dibble, NP  albuterol (VENTOLIN HFA) 108 (90 Base) MCG/ACT inhaler Inhale 2 puffs into the lungs every 6 (six) hours as needed for wheezing or shortness of breath. 04/10/22   Flossie Dibble, NP  predniSONE (DELTASONE) 10 MG tablet Take 6 tablets on the first day, 5 tablets on the second day, 4 tablets on the third day, 3 tablets on the fourth day, 2 tablets on the fifth day, and 1 tablet on the last day. 04/10/22   Flossie Dibble, NP    Family History Family History  Problem Relation Age of Onset   Diabetes Brother    Breast cancer Neg Hx     Social History Social History   Tobacco Use   Smoking status: Never   Smokeless tobacco: Never  Vaping Use   Vaping Use: Never used  Substance Use Topics   Alcohol use: No   Drug use: No     Allergies   Ibuprofen   Review of Systems Review of Systems  Constitutional: Negative.   HENT: Negative.    Respiratory:  Positive for cough, shortness of breath and wheezing.  Cardiovascular:  Negative for chest pain.  Gastrointestinal: Negative.   Neurological: Negative.      Physical Exam Triage Vital Signs ED Triage Vitals  Enc Vitals Group     BP 04/13/22 1735 111/76     Pulse Rate 04/13/22 1735 79     Resp 04/13/22 1735 19     Temp 04/13/22 1735 97.8 F (36.6 C)     Temp Source 04/13/22 1735 Oral     SpO2 04/13/22 1735 93 %     Weight --      Height --      Head Circumference --      Peak Flow --      Pain Score 04/13/22 1736 8     Pain Loc --      Pain Edu? --      Excl. in [place]? --    No data found.  Updated Vital Signs BP 111/76 (BP Location: Right Arm)   Pulse 79   Temp 97.8 F (36.6 C) (Oral)   Resp 19   LMP 04/05/2016   SpO2 93%   Visual Acuity Right Eye Distance:   Left Eye Distance:   Bilateral Distance:    Right Eye Near:   Left Eye Near:    Bilateral Near:     Physical Exam Vitals and nursing note reviewed.  Constitutional:      General: She is not in acute distress.     Appearance: She is not ill-appearing.  HENT:     Right Ear: Tympanic membrane normal.     Left Ear: Tympanic membrane normal.  Eyes:     Conjunctiva/sclera: Conjunctivae normal.  Cardiovascular:     Rate and Rhythm: Normal rate and regular rhythm.     Pulses: Normal pulses.     Heart sounds: Normal heart sounds.  Pulmonary:     Breath sounds: Wheezing and rhonchi present. No rales.  Chest:     Chest wall: No tenderness.  Abdominal:     General: Bowel sounds are normal.     Palpations: Abdomen is soft.  Skin:    General: Skin is warm.  Neurological:     Mental Status: She is alert.      UC Treatments / Results  Labs (all labs ordered are listed, but only abnormal results are displayed) Labs Reviewed  CBC WITH DIFFERENTIAL/PLATELET    EKG   Radiology DG Chest 2 View  Result Date: 04/13/2022 CLINICAL DATA:  Cough and shortness of breath. EXAM: CHEST - 2 VIEW COMPARISON:  05/06/2016 FINDINGS: The cardiomediastinal contours are normal. Mild bronchial thickening. Minor subsegmental atelectasis in the left mid lung. Pulmonary vasculature is normal. No consolidation, pleural effusion, or pneumothorax. No acute osseous abnormalities are seen. IMPRESSION: Mild bronchial thickening. Minor subsegmental atelectasis in the left mid lung. Electronically Signed   By: Keith Rake M.D.   On: 04/13/2022 18:27    Procedures Procedures (including critical care time)  Medications Ordered in UC Medications - No data to display  Initial Impression / Assessment and Plan / UC Course  I have reviewed the triage vital signs and the nursing notes.  Pertinent labs & imaging results that were available during my care of the patient were reviewed by me and considered in my medical decision making (see chart for details).     1.  Acute bronchitis with bronchospasm: Chest x-ray is significant for mild bronchial thickening Patient is advised to continue using albuterol inhaler.  Patient is  likely using a poor technique.  Education on the right technique was given to the patient Mucinex twice daily Tessalon Perles as needed for cough Return precautions given. Final Clinical Impressions(s) / UC Diagnoses   Final diagnoses:  Acute bronchitis with bronchospasm     Discharge Instructions      Please take medications as prescribed Your chest x-ray is suggestive of acute bronchitis Take medications as prescribed Please continue using your inhaler as well as your nebulizer machine If you have any further concerns please return to urgent care to be reevaluated.   ED Prescriptions     Medication Sig Dispense Auth. Provider   guaiFENesin (MUCINEX) 600 MG 12 hr tablet Take 1 tablet (600 mg total) by mouth 2 (two) times daily for 10 days. 20 tablet Cloris Flippo, Britta Mccreedy, MD   benzonatate (TESSALON) 100 MG capsule Take 1 capsule (100 mg total) by mouth 3 (three) times daily as needed for cough. 21 capsule Zoraida Havrilla, Britta Mccreedy, MD      PDMP not reviewed this encounter.   Merrilee Jansky, MD 04/13/22 1910

## 2022-04-13 NOTE — Discharge Instructions (Signed)
Please take medications as prescribed Your chest x-ray is suggestive of acute bronchitis Take medications as prescribed Please continue using your inhaler as well as your nebulizer machine If you have any further concerns please return to urgent care to be reevaluated.

## 2022-04-13 NOTE — ED Triage Notes (Signed)
Pt reports that she was seen here Friday for her asthma. Reports till having productive cough, congestion and SOB. Reports taking the prescriptions that was ordered for her. Pt also c/o facial and back pains. Denies taking anything for the pain.

## 2022-06-19 ENCOUNTER — Encounter (HOSPITAL_COMMUNITY): Payer: Self-pay

## 2022-06-19 ENCOUNTER — Ambulatory Visit (HOSPITAL_COMMUNITY)
Admission: EM | Admit: 2022-06-19 | Discharge: 2022-06-19 | Disposition: A | Discharge status: Other Acute Inpatient Hospital | Payer: No Typology Code available for payment source | Attending: Emergency Medicine | Admitting: Emergency Medicine

## 2022-06-19 ENCOUNTER — Emergency Department (HOSPITAL_COMMUNITY): Payer: No Typology Code available for payment source

## 2022-06-19 ENCOUNTER — Encounter (HOSPITAL_COMMUNITY): Payer: Self-pay | Admitting: *Deleted

## 2022-06-19 ENCOUNTER — Other Ambulatory Visit: Payer: Self-pay

## 2022-06-19 ENCOUNTER — Emergency Department (HOSPITAL_COMMUNITY)
Admission: EM | Admit: 2022-06-19 | Discharge: 2022-06-19 | Disposition: A | Discharge status: Home / Self Care | Payer: No Typology Code available for payment source | Attending: Emergency Medicine | Admitting: Emergency Medicine

## 2022-06-19 DIAGNOSIS — R519 Headache, unspecified: Secondary | ICD-10-CM

## 2022-06-19 DIAGNOSIS — R531 Weakness: Secondary | ICD-10-CM | POA: Insufficient documentation

## 2022-06-19 DIAGNOSIS — R400 Somnolence: Secondary | ICD-10-CM | POA: Insufficient documentation

## 2022-06-19 DIAGNOSIS — R110 Nausea: Secondary | ICD-10-CM | POA: Insufficient documentation

## 2022-06-19 LAB — CSF CELL COUNT WITH DIFFERENTIAL
RBC Count, CSF: 4775 /mm3 — ABNORMAL HIGH
Tube #: 4
WBC, CSF: 1 /mm3 (ref 0–5)

## 2022-06-19 LAB — I-STAT CHEM 8, ED
BUN: 18 mg/dL (ref 6–20)
Calcium, Ion: 1.11 mmol/L — ABNORMAL LOW (ref 1.15–1.40)
Chloride: 103 mmol/L (ref 98–111)
Creatinine, Ser: 0.6 mg/dL (ref 0.44–1.00)
Glucose, Bld: 122 mg/dL — ABNORMAL HIGH (ref 70–99)
HCT: 40 % (ref 36.0–46.0)
Hemoglobin: 13.6 g/dL (ref 12.0–15.0)
Potassium: 3.9 mmol/L (ref 3.5–5.1)
Sodium: 139 mmol/L (ref 135–145)
TCO2: 28 mmol/L (ref 22–32)

## 2022-06-19 LAB — PROTIME-INR
INR: 1 (ref 0.8–1.2)
Prothrombin Time: 13.4 seconds (ref 11.4–15.2)

## 2022-06-19 LAB — CBG MONITORING, ED: Glucose-Capillary: 166 mg/dL — ABNORMAL HIGH (ref 70–99)

## 2022-06-19 LAB — PROTEIN AND GLUCOSE, CSF
Glucose, CSF: 63 mg/dL (ref 40–70)
Total  Protein, CSF: 40 mg/dL (ref 15–45)

## 2022-06-19 MED ORDER — PROCHLORPERAZINE EDISYLATE 10 MG/2ML IJ SOLN
10.0000 mg | Freq: Once | INTRAMUSCULAR | Status: AC
  Administered 2022-06-19: 10 mg via INTRAVENOUS
  Filled 2022-06-19: qty 2

## 2022-06-19 MED ORDER — DIPHENHYDRAMINE HCL 50 MG/ML IJ SOLN
25.0000 mg | Freq: Once | INTRAMUSCULAR | Status: AC
  Administered 2022-06-19: 25 mg via INTRAVENOUS
  Filled 2022-06-19: qty 1

## 2022-06-19 MED ORDER — MAGNESIUM SULFATE IN D5W 1-5 GM/100ML-% IV SOLN
1.0000 g | Freq: Once | INTRAVENOUS | Status: AC
  Administered 2022-06-19: 1 g via INTRAVENOUS
  Filled 2022-06-19: qty 100

## 2022-06-19 MED ORDER — MORPHINE SULFATE (PF) 4 MG/ML IV SOLN
4.0000 mg | Freq: Once | INTRAVENOUS | Status: AC
  Administered 2022-06-19: 4 mg via INTRAVENOUS
  Filled 2022-06-19: qty 1

## 2022-06-19 MED ORDER — AMMONIA AROMATIC IN INHA
RESPIRATORY_TRACT | Status: AC
  Filled 2022-06-19: qty 10

## 2022-06-19 MED ORDER — IOHEXOL 350 MG/ML SOLN
75.0000 mL | Freq: Once | INTRAVENOUS | Status: AC | PRN
  Administered 2022-06-19: 75 mL via INTRAVENOUS

## 2022-06-19 MED ORDER — METOCLOPRAMIDE HCL 5 MG/ML IJ SOLN
10.0000 mg | Freq: Once | INTRAMUSCULAR | Status: AC
  Administered 2022-06-19: 10 mg via INTRAVENOUS
  Filled 2022-06-19: qty 2

## 2022-06-19 NOTE — ED Provider Notes (Addendum)
Seen after prior ED provider.  CSF studies are suggestive of traumatic tap.  No clear evidence of infection.  CT and CT angio studies are without significant acute abnormality.  Patient feels significantly improved on reevaluation.  She is pain-free.  She denies associated nausea, vision change, weakness etc.  Patient desires discharge home.  Presentation is perhaps most consistent with migraine headache.  Patient does understand need for close outpatient follow-up.  Strict return precautions given and understood.  At time of discharge patient is alert, comfortable, pain-free, and neurologically intact.  Translator services utilized for discharge discussion.    Valarie Merino, MD 06/19/22 2123    Valarie Merino, MD 06/19/22 2124

## 2022-06-19 NOTE — Discharge Instructions (Signed)
Return for any problem.  ?

## 2022-06-19 NOTE — ED Provider Notes (Signed)
[facility]   CSN: [id number] Arrival date & time: 06/19/22  1322     History  Chief Complaint  Patient presents with   Headache    Sydney Lopez is a 52 y.o. female.  HPI     52 year old female with no significant medical history comes in with chief complaint of severe headache.  Translation service was utilized for this visit.  Patient indicates that she woke up feeling well.  Around 11 AM she started having severe headache and she went to urgent care.  She was advised to come to the ER.  Per urgent care note, patient was weak and somnolent.  Patient states that her headache is generalized, severe.  Initially it was moderately severe but it became excruciating immediately.  She has associated nausea.  Patient denies any specific evoking, aggravating or relieving factors.  There is no associated one-sided weakness, numbness, vision change, slurred speech, neck pain or stiffness.  Review of system is negative for any URI-like symptoms, fevers, chills, trauma. Patient does not have any history of headaches.  Home Medications Prior to Admission medications   Medication Sig Start Date End Date Taking? Authorizing Provider  acetaminophen (TYLENOL) 325 MG tablet Take 650 mg by mouth every 6 (six) hours as needed.    [provider]  albuterol (PROVENTIL) (2.5 MG/3ML) 0.083% nebulizer solution Take 3 mLs (2.5 mg total) by nebulization every 6 (six) hours as needed for wheezing or shortness of breath. 04/10/22   Flossie Dibble, NP  albuterol (VENTOLIN HFA) 108 (90 Base) MCG/ACT inhaler Inhale 2 puffs into the lungs every 6 (six) hours as needed for wheezing or shortness of breath. 04/10/22   Flossie Dibble, NP  benzonatate (TESSALON) 100 MG capsule Take 1 capsule (100 mg total) by mouth 3 (three) times daily as needed for cough. 04/13/22   Lamptey, Myrene Galas, MD  predniSONE (DELTASONE) 10 MG tablet Take 6  tablets on the first day, 5 tablets on the second day, 4 tablets on the third day, 3 tablets on the fourth day, 2 tablets on the fifth day, and 1 tablet on the last day. 04/10/22   Flossie Dibble, NP      Allergies    Ibuprofen    Review of Systems   Review of Systems  All other systems reviewed and are negative.   Physical Exam Updated Vital Signs BP (!) 159/99   Pulse 65   Temp (!) 97.3 F (36.3 C) (Oral)   Resp 17   LMP 04/05/2016   SpO2 99%  Physical Exam Vitals and nursing note reviewed.  Constitutional:      Appearance: She is well-developed.  HENT:     Head: Atraumatic.  Eyes:     General: No visual field deficit. Neck:     Comments: Peripheral vision fields are normal. Cardiovascular:     Rate and Rhythm: Normal rate.  Pulmonary:     Effort: Pulmonary effort is normal.  Musculoskeletal:     Cervical back: Normal range of motion and neck supple.  Skin:    General: Skin is warm and dry.  Neurological:     Mental Status: She is alert and oriented to person, place, and time.     GCS: GCS eye subscore is 4. GCS verbal subscore is 5. GCS motor subscore is 6.     Cranial Nerves: No cranial nerve deficit, dysarthria or facial asymmetry.     Sensory: No sensory  deficit.     Motor: No weakness.     Comments: No meningismus     ED Results / Procedures / Treatments   Labs (all labs ordered are listed, but only abnormal results are displayed) Labs Reviewed  I-STAT CHEM 8, ED - Abnormal; Notable for the following components:      Result Value   Glucose, Bld 122 (*)    Calcium, Ion 1.11 (*)    All other components within normal limits  PROTIME-INR  CBC WITH DIFFERENTIAL/PLATELET  CBC WITH DIFFERENTIAL/PLATELET  CSF CELL COUNT WITH DIFFERENTIAL  CSF CELL COUNT WITH DIFFERENTIAL    EKG EKG Interpretation  Date/Time:  Friday June 19 2022 13:31:46 EST Ventricular Rate:  69 PR Interval:  147 QRS Duration: 88 QT Interval:  408 QTC  Calculation: 438 R Axis:   59 Text Interpretation: Sinus rhythm No acute changes No significant change since last tracing Confirmed by Varney Biles [phone]) on 06/19/2022 3:24:48 PM  Radiology CT Head Wo Contrast  Result Date: 06/19/2022 CLINICAL DATA:  New onset of headache EXAM: CT HEAD WITHOUT CONTRAST TECHNIQUE: Contiguous axial images were obtained from the base of the skull through the vertex without intravenous contrast. RADIATION DOSE REDUCTION: This exam was performed according to the departmental dose-optimization program which includes automated exposure control, adjustment of the mA and/or kV according to patient size and/or use of iterative reconstruction technique. COMPARISON:  01/27/2015, report only FINDINGS: Brain: No mass lesion, hemorrhage, hydrocephalus, acute infarct, intra-axial, or extra-axial fluid collection. Vascular: No hyperdense vessel or unexpected calcification. Skull: Normal Sinuses/Orbits: Normal imaged portions of the orbits and globes. Clear paranasal sinuses and mastoid air cells. Other: None. IMPRESSION: Normal head CT. Electronically Signed   By: Abigail Miyamoto M.D.   On: 06/19/2022 14:28    Procedures .Critical Care  Performed by: Varney Biles, MD Authorized by: Varney Biles, MD   Critical care provider statement:    Critical care time (minutes):  41   Critical care was necessary to treat or prevent imminent or life-threatening deterioration of the following conditions:  CNS failure or compromise   Critical care was time spent personally by me on the following activities:  Development of treatment plan with patient or surrogate, discussions with consultants, evaluation of patient's response to treatment, examination of patient, ordering and review of laboratory studies, ordering and review of radiographic studies, ordering and performing treatments and interventions, pulse oximetry, re-evaluation of patient's condition and review of old charts .Lumbar  Puncture  Date/Time: 06/19/2022 4:14 PM  Performed by: Varney Biles, MD Authorized by: Varney Biles, MD   Consent:    Consent obtained:  Verbal   Consent given by:  Patient   Risks, benefits, and alternatives were discussed: yes     Risks discussed:  Bleeding, infection, pain, repeat procedure and headache   Alternatives discussed:  No treatment Universal protocol:    Procedure explained and questions answered to patient or proxy's satisfaction: yes     Site/side marked: yes     Patient identity confirmed:  Arm band Pre-procedure details:    Procedure purpose:  Diagnostic   Preparation: Patient was prepped and draped in usual sterile fashion   Anesthesia:    Anesthesia method:  Local infiltration   Local anesthetic:  Lidocaine 1% WITH epi Procedure details:    Lumbar space:  L4-L5 interspace   Needle gauge:  18   Needle type:  Diamond point   Needle length (in):  3.5   Ultrasound guidance: no  Number of attempts:  1   Fluid appearance:  Blood-tinged and bloody   Tubes of fluid:  4   Total volume (ml):  10 Post-procedure details:    Puncture site:  Adhesive bandage applied     Medications Ordered in ED Medications  magnesium sulfate IVPB 1 g 100 mL (has no administration in time range)  metoCLOPramide (REGLAN) injection 10 mg (10 mg Intravenous Given 06/19/22 1446)  prochlorperazine (COMPAZINE) injection 10 mg (10 mg Intravenous Given 06/19/22 1616)  diphenhydrAMINE (BENADRYL) injection 25 mg (25 mg Intravenous Given 06/19/22 1615)    ED Course/ Medical Decision Making/ A&P                             Medical Decision Making Amount and/or Complexity of Data Reviewed Labs: ordered. Radiology: ordered.  Risk Prescription drug management.   This patient presents to the ED with chief complaint(s) of severe headache with associated nausea with no concerning past pertinent medical history, no concerning social history.The complaint involves an extensive  differential diagnosis and also carries with it a high risk of complications and morbidity.    The differential diagnosis includes  Primary headaches - including migrainous headaches, cluster headaches, tension headaches. ICH Carotid dissection Cavernous sinus thrombosis Meningitis Encephalitis Sinusitis Tumor Vascular headaches AV malformation Brain aneurysm Muscular headaches   The initial plan is to get CT scan of the brain. If the CT scan is negative, then we will have to decide if patient needs LP.  Patient is here within 6 hours of the onset of headache.  Additional history obtained: Records reviewed  urgent care note from earlier today  Independent labs interpretation:  The following labs were independently interpreted: Labs are appropriate, CBC is normal.  Independent visualization and interpretation of imaging: - I independently visualized the following imaging with scope of interpretation limited to determining acute life threatening conditions related to emergency care: CT scan of the brain, which revealed no evidence of brain bleed  Treatment and Reassessment: Patient reassessed after CT scan of the head.  She had received Reglan.  She continues to have severe headache with nausea.  I will proceed with lumbar puncture.  4:17 PM Unfortunately, the LP was a bloody tap.  CT angiogram head and neck has been added.  Patient's care will be followed up by Dr. Francia Greaves.  Final Clinical Impression(s) / ED Diagnoses Final diagnoses:  Severe headache    Rx / DC Orders ED Discharge Orders     None         Varney Biles, MD 06/19/22 [phone]

## 2022-06-19 NOTE — ED Notes (Signed)
Called [facility] they advised the truck would be an hour out and advised to call EMS. Spoke to provider and she advised to call EMS. EMS called and truck on the way.

## 2022-06-19 NOTE — ED Triage Notes (Signed)
Pt BIB [facility] from [facility]. Patient had a sudden onset of headache and vomiting at 1100 this AM. Family took her to urgent care, she was lethargic. Minimally responsive to stimuli. With [facility], no focal deficits. Patient only complaining of headache at this time.   NSR 60 140/70 166 CBG  History of Asthma, 20# L wrist

## 2022-06-19 NOTE — ED Triage Notes (Signed)
Pt brought in by EMS from urgent care. Pt was at urgent care with headache and dizziness that began today. Urgent care was also concerned with lethary and AMS, pt is lethargic but arousable. Pt is oriented to self and situation but unable to state the year/month.  Pt is now nauseous.

## 2022-06-19 NOTE — ED Notes (Signed)
Radio broadcast assistant at [employer] to advise of pt status

## 2022-06-19 NOTE — ED Triage Notes (Signed)
Pts family said that around 11am pt started talking to them , she wouldn't wake up, she complains of headache, vomiting. In clinic she has no muscle control, pt has altered mental status.

## 2022-06-19 NOTE — ED Notes (Signed)
Got patient undressed into a gown on the monitor did EKG shown to er provider patient is resting with call bell in reach

## 2022-06-19 NOTE — ED Notes (Signed)
[facility] called back and states that EMS called them and advised they can't come get pt that [facility] needs to come for patient transport. Per [facility] they are sending a truck now.

## 2022-06-19 NOTE — ED Provider Notes (Signed)
Presents with daughter and husband  This provider called into triage to evaluate patient as she was not responding and unable to hold her head up  On initial assessment patient does not open eyes to sound or pain Making incomprehensible sounds. Does not form words Does not protect face when arm is dropped Initial GCS is 4  Ammonia smelling-salts given with patient response Responds to sternal rub. She will open eyes to verbal stimuli. Oriented to person, not place or time  GCS 11. 3/4/4 Afebrile, BP 151/92, HR 71. Sating 100% on room air CBG 166  Complains of headache. Family denies injury or trauma Patient had several pieces of bandage-like material on bilateral external ears. About 5-6 each side. Cannot tell what they are, family is not sure.  IV placed in left lower arm  [facility] transporting to ED   Kanaan Kagawa, Wells Guiles, Hershal Coria 06/19/22 1316

## 2022-06-22 LAB — CSF CULTURE W GRAM STAIN: Culture: NO GROWTH

## 2022-08-18 ENCOUNTER — Encounter (HOSPITAL_COMMUNITY): Payer: Self-pay | Admitting: *Deleted

## 2022-08-18 ENCOUNTER — Ambulatory Visit (HOSPITAL_COMMUNITY)
Admission: EM | Admit: 2022-08-18 | Discharge: 2022-08-18 | Disposition: A | Discharge status: Home / Self Care | Payer: Self-pay | Attending: Family Medicine | Admitting: Family Medicine

## 2022-08-18 ENCOUNTER — Other Ambulatory Visit: Payer: Self-pay

## 2022-08-18 DIAGNOSIS — J4521 Mild intermittent asthma with (acute) exacerbation: Secondary | ICD-10-CM

## 2022-08-18 MED ORDER — PREDNISONE 20 MG PO TABS: 40.0000 mg | ORAL_TABLET | Freq: Every day | ORAL | 0 refills | Status: AC

## 2022-08-18 MED ORDER — METHYLPREDNISOLONE ACETATE 80 MG/ML IJ SUSP
80.0000 mg | Freq: Once | INTRAMUSCULAR | Status: AC
  Administered 2022-08-18: 80 mg via INTRAMUSCULAR

## 2022-08-18 MED ORDER — ALBUTEROL SULFATE (2.5 MG/3ML) 0.083% IN NEBU: 2.5000 mg | INHALATION_SOLUTION | RESPIRATORY_TRACT | 0 refills | Status: AC | PRN

## 2022-08-18 MED ORDER — METHYLPREDNISOLONE ACETATE 80 MG/ML IJ SUSP
INTRAMUSCULAR | Status: AC
  Filled 2022-08-18: qty 1

## 2022-08-18 NOTE — Discharge Instructions (Signed)
Albuterol inhaler--do 2 puffs every 4 hours as needed for shortness of breath or wheezing, or you can use the albuterol in the nebulizer every 4 hours as needed   (Haga 2 inhalaciones con la bombita cada 4 horas si le falta aire o tiene tos apretado, o puede usar el albuterol en la maquina cada 4 horas cuando se necesita)  Take prednisone 20 mg--2 daily for 5 days.  (Tome 2 tabletas por la boca diaramente por 5 dias)  We have given you a shot of depomedrol 80 mg (le hemos dado una inyeccion de depomedrol 80 mg hoy)

## 2022-08-18 NOTE — ED Triage Notes (Addendum)
Pt reports worsening of Asthma since [facility]. Pt does not any meds for asthma. PT went to pharmacy to pick up medicine and was told the med. Had not been approved. Pt reports SHOB , HA.

## 2022-08-18 NOTE — ED Provider Notes (Signed)
[facility]    CSN: [id number] Arrival date & time: 08/18/22  1332      History   Chief Complaint Chief Complaint  Patient presents with   Asthma    HPI Sydney Lopez is a 52 y.o. female.    Asthma   Here for asthma that has been bothering her in the last 3 days.  No fever.  No vomiting.  She has had some congestion in her chest.  She states the albuterol generic that she has been trying to use does not help.  On a previous visit she had been educated on appropriate administration.  She states that the "blue one" helped better.  Can only assume that this is a Ventolin brand inhaler.  Past Medical History:  Diagnosis Date   Asthma     Patient Active Problem List   Diagnosis Date Noted   Screening breast examination 04/18/2019    Past Surgical History:  Procedure Laterality Date   CHOLECYSTECTOMY     SHOULDER SURGERY      OB History     Gravida  5   Para  4   Term  4   Preterm      AB  1   Living  4      SAB  1   IAB      Ectopic      Multiple      Live Births               Home Medications    Prior to Admission medications   Medication Sig Start Date End Date Taking? Authorizing Provider  predniSONE (DELTASONE) 20 MG tablet Take 2 tablets (40 mg total) by mouth daily with breakfast for 5 days. 08/18/22 08/23/22 Yes Essynce Munsch, Janace Aris, MD  acetaminophen (TYLENOL) 325 MG tablet Take 650 mg by mouth every 6 (six) hours as needed.    [provider]  albuterol (PROVENTIL) (2.5 MG/3ML) 0.083% nebulizer solution Take 3 mLs (2.5 mg total) by nebulization every 4 (four) hours as needed for wheezing or shortness of breath. 08/18/22   Zenia Resides, MD  albuterol (VENTOLIN HFA) 108 (90 Base) MCG/ACT inhaler Inhale 2 puffs into the lungs every 6 (six) hours as needed for wheezing or shortness of breath. 04/10/22   Debby Freiberg, NP    Family History Family History  Problem Relation Age of Onset   Diabetes  Brother    Breast cancer Neg Hx     Social History Social History   Tobacco Use   Smoking status: Never   Smokeless tobacco: Never  Vaping Use   Vaping Use: Never used  Substance Use Topics   Alcohol use: No   Drug use: No     Allergies   Ibuprofen   Review of Systems Review of Systems   Physical Exam Triage Vital Signs ED Triage Vitals  Enc Vitals Group     BP 08/18/22 1446 109/73     Pulse Rate 08/18/22 1446 85     Resp 08/18/22 1446 18     Temp 08/18/22 1446 98.1 F (36.7 C)     Temp src --      SpO2 08/18/22 1446 94 %     Weight --      Height --      Head Circumference --      Peak Flow --      Pain Score 08/18/22 1444 8     Pain Loc --  Pain Edu? --      Excl. in GC? --    No data found.  Updated Vital Signs BP 109/73   Pulse 85   Temp 98.1 F (36.7 C)   Resp 18   LMP 04/05/2016   SpO2 94%   Visual Acuity Right Eye Distance:   Left Eye Distance:   Bilateral Distance:    Right Eye Near:   Left Eye Near:    Bilateral Near:     Physical Exam Vitals reviewed.  Constitutional:      General: She is not in acute distress.    Appearance: She is not toxic-appearing.  HENT:     Nose: Nose normal.     Mouth/Throat:     Mouth: Mucous membranes are moist.     Pharynx: No oropharyngeal exudate or posterior oropharyngeal erythema.  Eyes:     Extraocular Movements: Extraocular movements intact.     Conjunctiva/sclera: Conjunctivae normal.     Pupils: Pupils are equal, round, and reactive to light.  Cardiovascular:     Rate and Rhythm: Normal rate and regular rhythm.     Heart sounds: No murmur heard. Pulmonary:     Effort: Pulmonary effort is normal. No respiratory distress.     Breath sounds: No stridor. No rhonchi or rales.     Comments: There are some end expiratory wheezes.  Air movement is still good. Musculoskeletal:     Cervical back: Neck supple.  Lymphadenopathy:     Cervical: No cervical adenopathy.  Skin:    Capillary  Refill: Capillary refill takes less than 2 seconds.     Coloration: Skin is not jaundiced or pale.  Neurological:     General: No focal deficit present.     Mental Status: She is alert and oriented to person, place, and time.  Psychiatric:        Behavior: Behavior normal.      UC Treatments / Results  Labs (all labs ordered are listed, but only abnormal results are displayed) Labs Reviewed - No data to display  EKG   Radiology No results found.  Procedures Procedures (including critical care time)  Medications Ordered in UC Medications  methylPREDNISolone acetate (DEPO-MEDROL) injection 80 mg (has no administration in time range)    Initial Impression / Assessment and Plan / UC Course  I have reviewed the triage vital signs and the nursing notes.  Pertinent labs & imaging results that were available during my care of the patient were reviewed by me and considered in my medical decision making (see chart for details).        I was going to try to get her brand-name Ventolin or Combivent on a good Rx coupon, but the range and price was from $70-$500.  Instead we are going to dispense her a nebulizer here and supply albuterol for the nebulizer on the prescription.  Prednisone is sent in for the asthma exacerbation.  I discussed with her that it is important that she establish with primary care.  Assistance requested to help her find a primary care  Final diagnoses:  Mild intermittent asthma with exacerbation     Discharge Instructions      Albuterol inhaler--do 2 puffs every 4 hours as needed for shortness of breath or wheezing, or you can use the albuterol in the nebulizer every 4 hours as needed   (Haga 2 inhalaciones con la bombita cada 4 horas si le falta aire o tiene tos apretado, o puede usar el albuterol  en la maquina cada 4 horas cuando se necesita)  Take prednisone 20 mg--2 daily for 5 days.  (Tome 2 tabletas por la boca diaramente por 5 dias)  We have  given you a shot of depomedrol 80 mg (le hemos dado una inyeccion de depomedrol 80 mg hoy)       ED Prescriptions     Medication Sig Dispense Auth. Provider   albuterol (PROVENTIL) (2.5 MG/3ML) 0.083% nebulizer solution Take 3 mLs (2.5 mg total) by nebulization every 4 (four) hours as needed for wheezing or shortness of breath. 225 mL Zenia Resides, MD   predniSONE (DELTASONE) 20 MG tablet Take 2 tablets (40 mg total) by mouth daily with breakfast for 5 days. 10 tablet Marlinda Mike Janace Aris, MD      PDMP not reviewed this encounter.   Zenia Resides, MD 08/18/22 [phone]

## 2022-11-09 ENCOUNTER — Ambulatory Visit (HOSPITAL_COMMUNITY)
Admission: EM | Admit: 2022-11-09 | Discharge: 2022-11-09 | Disposition: A | Discharge status: Home / Self Care | Payer: Self-pay | Attending: Nurse Practitioner | Admitting: Nurse Practitioner

## 2022-11-09 ENCOUNTER — Encounter (HOSPITAL_COMMUNITY): Payer: Self-pay | Admitting: Emergency Medicine

## 2022-11-09 DIAGNOSIS — T161XXA Foreign body in right ear, initial encounter: Secondary | ICD-10-CM

## 2022-11-09 NOTE — Discharge Instructions (Signed)
We removed the cotton from your ear canal today.  Please stop using Q tips.   Recommend establishing care with PCP for health checks and to discuss any chronic concerns

## 2022-11-09 NOTE — ED Triage Notes (Signed)
Used interpretor  Yesterday patient used cotton swab in ear and the cotton got stuck in right ear. Reports intermittent pain

## 2022-11-09 NOTE — ED Provider Notes (Signed)
[facility]    CSN: [id number] Arrival date & time: 11/09/22  0843      History   Chief Complaint Chief Complaint  Patient presents with   Foreign Body in Ear    HPI Sydney Lopez is a 52 y.o. female.   Patient presents today with foreign body to right ear.  Reports she was cleaning her ear with a Q-tip and the cotton and got stuck in her ear.  She endorses muffled hearing and intermittent pain ever since.  No ear drainage, fever, cough, congestion, sore throat.  Has not tried anything for symptoms so far.  Spanish interpreter was used for this visit.     Past Medical History:  Diagnosis Date   Asthma     Patient Active Problem List   Diagnosis Date Noted   Screening breast examination 04/18/2019    Past Surgical History:  Procedure Laterality Date   CHOLECYSTECTOMY     SHOULDER SURGERY      OB History     Gravida  5   Para  4   Term  4   Preterm      AB  1   Living  4      SAB  1   IAB      Ectopic      Multiple      Live Births               Home Medications    Prior to Admission medications   Medication Sig Start Date End Date Taking? Authorizing Provider  acetaminophen (TYLENOL) 325 MG tablet Take 650 mg by mouth every 6 (six) hours as needed.    [provider]  albuterol (PROVENTIL) (2.5 MG/3ML) 0.083% nebulizer solution Take 3 mLs (2.5 mg total) by nebulization every 4 (four) hours as needed for wheezing or shortness of breath. 08/18/22   Zenia Resides, MD  albuterol (VENTOLIN HFA) 108 (90 Base) MCG/ACT inhaler Inhale 2 puffs into the lungs every 6 (six) hours as needed for wheezing or shortness of breath. 04/10/22   Debby Freiberg, NP    Family History Family History  Problem Relation Age of Onset   Diabetes Brother    Breast cancer Neg Hx     Social History Social History   Tobacco Use   Smoking status: Never   Smokeless tobacco: Never  Vaping Use   Vaping status: Never Used   Substance Use Topics   Alcohol use: No   Drug use: No     Allergies   Ibuprofen   Review of Systems Review of Systems Per HPI  Physical Exam Triage Vital Signs ED Triage Vitals  Encounter Vitals Group     BP 11/09/22 0930 120/74     Systolic BP Percentile --      Diastolic BP Percentile --      Pulse Rate 11/09/22 0930 71     Resp 11/09/22 0930 17     Temp 11/09/22 0930 97.9 F (36.6 C)     Temp Source 11/09/22 0930 Oral     SpO2 11/09/22 0930 96 %     Weight --      Height --      Head Circumference --      Peak Flow --      Pain Score 11/09/22 0928 6     Pain Loc --      Pain Education --      Exclude from Growth Chart --  No data found.  Updated Vital Signs BP 120/74 (BP Location: Left Arm)   Pulse 71   Temp 97.9 F (36.6 C) (Oral)   Resp 17   LMP 04/05/2016   SpO2 96%   Visual Acuity Right Eye Distance:   Left Eye Distance:   Bilateral Distance:    Right Eye Near:   Left Eye Near:    Bilateral Near:     Physical Exam Vitals and nursing note reviewed.  Constitutional:      General: She is not in acute distress.    Appearance: Normal appearance. She is not toxic-appearing.  HENT:     Head: Normocephalic and atraumatic.     Left Ear: Tympanic membrane, ear canal and external ear normal. There is no impacted cerumen.     Mouth/Throat:     Mouth: Mucous membranes are moist.     Pharynx: Oropharynx is clear.  Pulmonary:     Effort: Pulmonary effort is normal. No respiratory distress.  Musculoskeletal:     Cervical back: Normal range of motion.  Lymphadenopathy:     Cervical: No cervical adenopathy.  Skin:    General: Skin is warm and dry.     Coloration: Skin is not jaundiced or pale.     Findings: No erythema.  Neurological:     Mental Status: She is alert and oriented to person, place, and time.  Psychiatric:        Behavior: Behavior is cooperative.      UC Treatments / Results  Labs (all labs ordered are listed, but only  abnormal results are displayed) Labs Reviewed - No data to display  EKG   Radiology No results found.  Procedures Foreign Body Removal  Date/Time: 11/09/2022 10:23 AM  Performed by: Valentino Nose, NP Authorized by: Valentino Nose, NP   Consent:    Consent obtained:  Verbal   Consent given by:  Patient   Risks, benefits, and alternatives were discussed: yes     Risks discussed:  Bleeding, infection, pain, worsening of condition and incomplete removal   Alternatives discussed:  Observation and alternative treatment Universal protocol:    Procedure explained and questions answered to patient or proxy's satisfaction: yes     Patient identity confirmed:  Verbally with patient Location:    Location:  Ear   Ear location:  R ear   Tendon involvement:  None Pre-procedure details:    Neurovascular status: intact   Anesthesia:    Anesthesia method:  None Procedure type:    Procedure complexity:  Simple Procedure details:    Removal mechanism:  Forceps (alligator forceps)   Foreign bodies recovered:  1   Description:  Serous tinged cotton   Intact foreign body removal: yes   Post-procedure details:    Neurovascular status: intact     Confirmation:  No additional foreign bodies on visualization   Skin closure:  None   Dressing:  Open (no dressing)   Procedure completion:  Tolerated well, no immediate complications  (including critical care time)  Medications Ordered in UC Medications - No data to display  Initial Impression / Assessment and Plan / UC Course  I have reviewed the triage vital signs and the nursing notes.  Pertinent labs & imaging results that were available during my care of the patient were reviewed by me and considered in my medical decision making (see chart for details).   Patient is well-appearing, normotensive, afebrile, not tachycardic, not tachypneic, oxygenating well on room air.  1. Foreign body of right ear, initial encounter Foreign  body removed with alligator forceps as above Patient tolerated well After removal, tympanic membrane intact and no bleeding visualized Recommended discontinuing use of Q-tips   Patient was encouraged to establish care with primary care provider and appointment was made prior to patient leaving clinic today.  The patient was given the opportunity to ask questions.  All questions answered to their satisfaction.  The patient is in agreement to this plan.    Final Clinical Impressions(s) / UC Diagnoses   Final diagnoses:  Foreign body of right ear, initial encounter     Discharge Instructions      We removed the cotton from your ear canal today.  Please stop using Q tips.   Recommend establishing care with PCP for health checks and to discuss any chronic concerns    ED Prescriptions   None    PDMP not reviewed this encounter.   Valentino Nose, NP 11/09/22 1025

## 2022-12-07 ENCOUNTER — Ambulatory Visit: Payer: Self-pay | Admitting: Family

## 2023-02-22 IMAGING — MG MM DIGITAL SCREENING BILAT W/ TOMO AND CAD
8 series · 9 of 24 positions shown · non-contrast
Comparison: Previous exam(s).

CLINICAL DATA: Screening.

EXAM:
DIGITAL SCREENING BILATERAL MAMMOGRAM WITH TOMOSYNTHESIS AND CAD
TECHNIQUE: Bilateral screening digital craniocaudal and mediolateral oblique
mammograms were obtained. Bilateral screening digital breast
tomosynthesis was performed. The images were evaluated with
computer-aided detection.

[L MLO synth-2D]
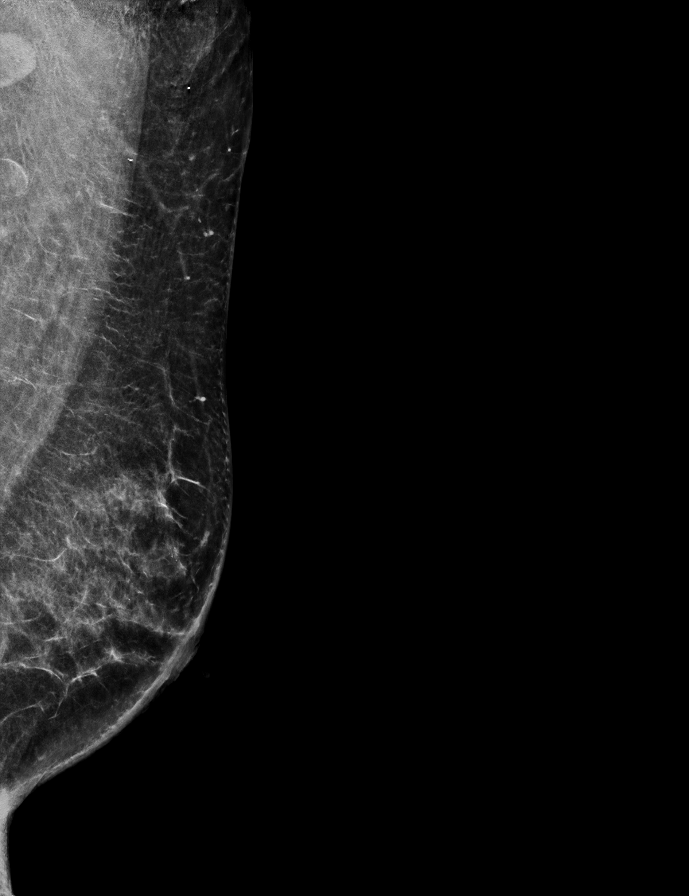

[R MLO synth-2D]
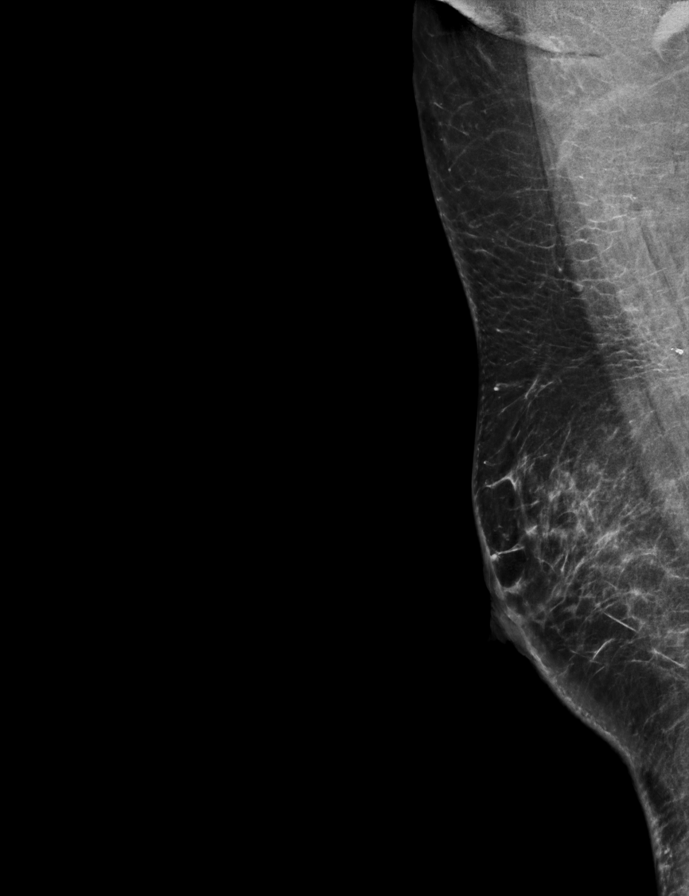

[R CC synth-2D]
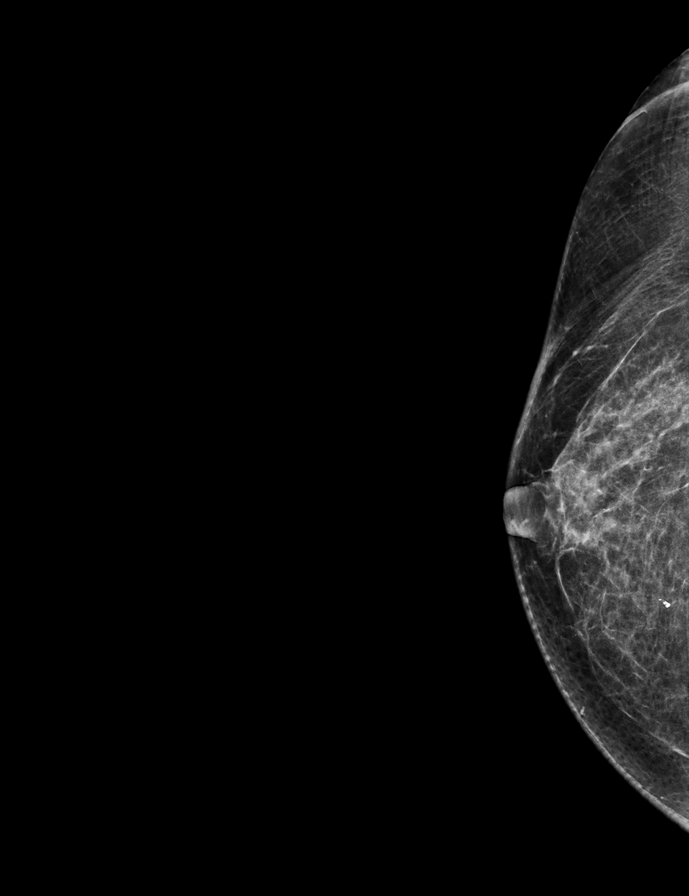

[L CC synth-2D]
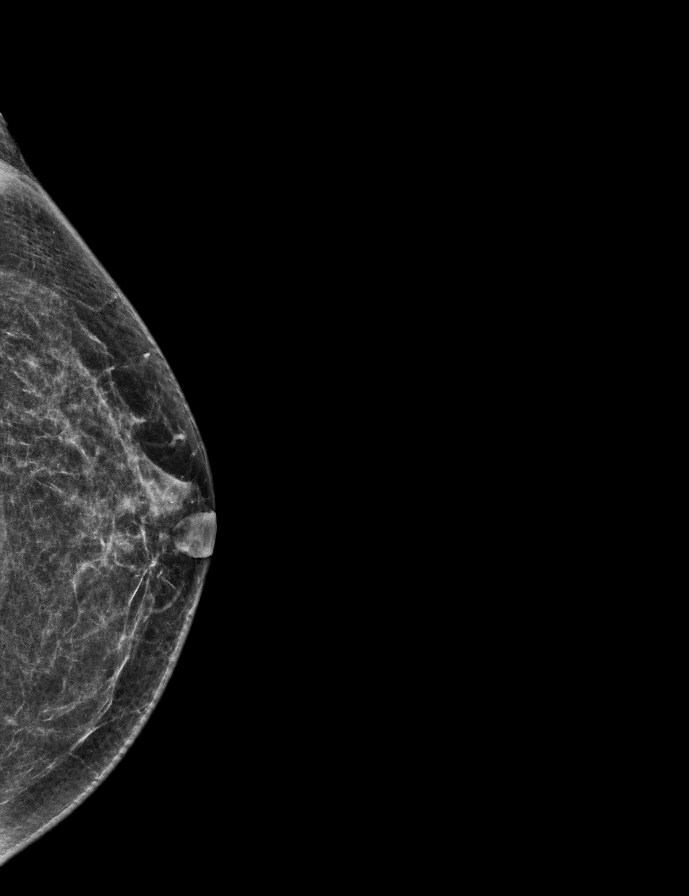

[R MLO tomo · 2 of 67 frames shown]
[frame 22/67]
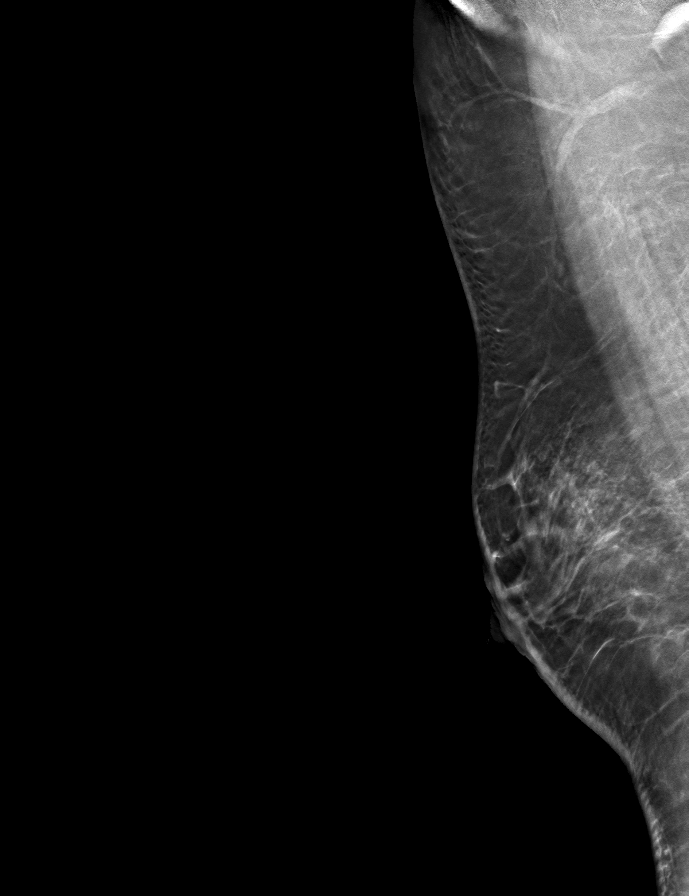
[frame 34/67]
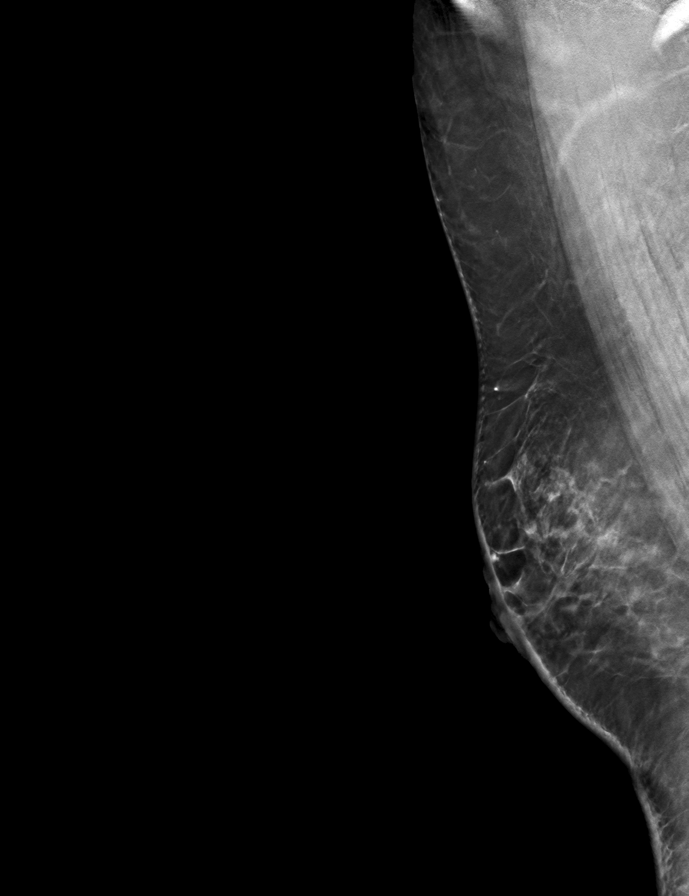

[L MLO tomo · tomo slice 35/69.0]
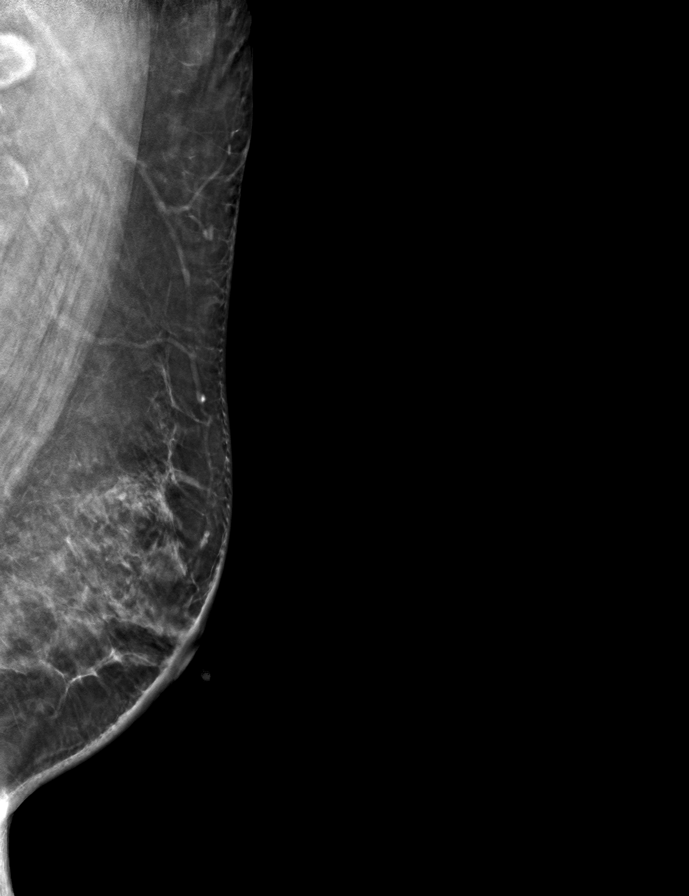

[L CC tomo · tomo slice 29/56.0]
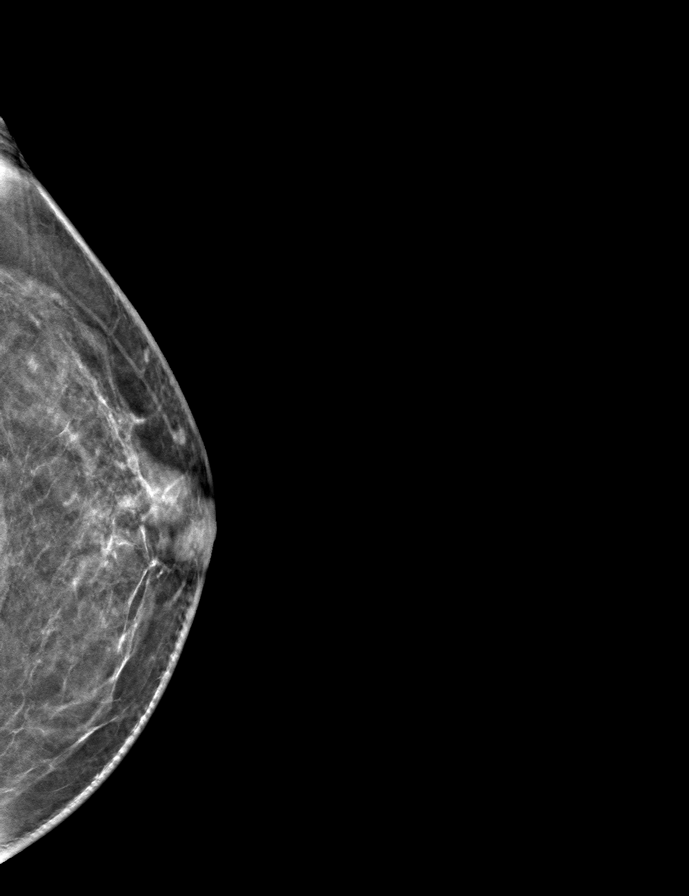

[R CC tomo · tomo slice 31/60.0]
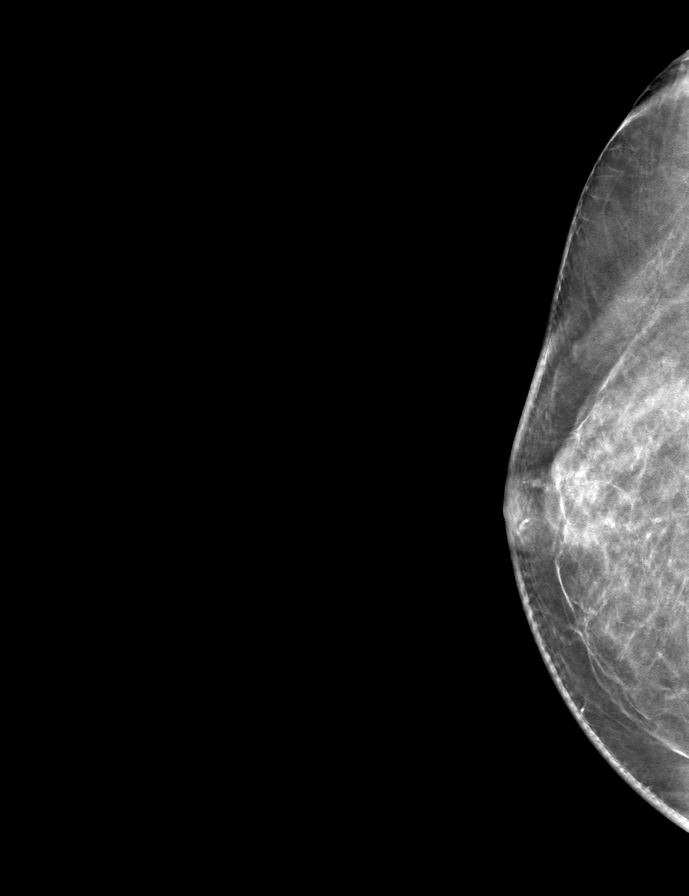

[9 of 24 positions shown; findings below may reference images not displayed]

ACR Breast Density Category b: There are scattered areas of
fibroglandular density.
FINDINGS: There are no findings suspicious for malignancy.
IMPRESSION: No mammographic evidence of malignancy. A result letter of this
screening mammogram will be mailed directly to the patient.

RECOMMENDATION:
Screening mammogram in one year. (Code:51-O-LD2)

BI-RADS CATEGORY  1: Negative.

## 2024-04-22 ENCOUNTER — Emergency Department (HOSPITAL_COMMUNITY): Payer: Self-pay

## 2024-04-22 ENCOUNTER — Ambulatory Visit (HOSPITAL_COMMUNITY)
Admission: EM | Admit: 2024-04-22 | Discharge: 2024-04-22 | Disposition: A | Discharge status: Home / Self Care | Payer: Self-pay

## 2024-04-22 ENCOUNTER — Encounter (HOSPITAL_COMMUNITY): Payer: Self-pay | Admitting: Emergency Medicine

## 2024-04-22 ENCOUNTER — Other Ambulatory Visit: Payer: Self-pay

## 2024-04-22 ENCOUNTER — Emergency Department (HOSPITAL_COMMUNITY)
Admission: EM | Admit: 2024-04-22 | Discharge: 2024-04-22 | Disposition: A | Discharge status: Home / Self Care | Payer: Self-pay | Attending: Emergency Medicine | Admitting: Emergency Medicine

## 2024-04-22 ENCOUNTER — Encounter (HOSPITAL_COMMUNITY): Payer: Self-pay

## 2024-04-22 DIAGNOSIS — R079 Chest pain, unspecified: Secondary | ICD-10-CM | POA: Insufficient documentation

## 2024-04-22 DIAGNOSIS — R112 Nausea with vomiting, unspecified: Secondary | ICD-10-CM | POA: Insufficient documentation

## 2024-04-22 DIAGNOSIS — R1085 Abdominal pain of multiple sites: Secondary | ICD-10-CM

## 2024-04-22 DIAGNOSIS — R1012 Left upper quadrant pain: Secondary | ICD-10-CM | POA: Insufficient documentation

## 2024-04-22 DIAGNOSIS — R1010 Upper abdominal pain, unspecified: Secondary | ICD-10-CM

## 2024-04-22 LAB — TROPONIN T, HIGH SENSITIVITY: Troponin T High Sensitivity: <15 ng/L (ref 0–19)

## 2024-04-22 LAB — CBC WITH DIFFERENTIAL/PLATELET
Abs Immature Granulocytes: 0.03 K/uL (ref 0.00–0.07)
Basophils Absolute: 0 K/uL (ref 0.0–0.1)
Basophils Relative: 0 %
Eosinophils Absolute: 0.1 K/uL (ref 0.0–0.5)
Eosinophils Relative: 1 %
HCT: 42.8 % (ref 36.0–46.0)
Hemoglobin: 14.9 g/dL (ref 12.0–15.0)
Immature Granulocytes: 0 %
Lymphocytes Relative: 16 %
Lymphs Abs: 1.4 K/uL (ref 0.7–4.0)
MCH: 31 pg (ref 26.0–34.0)
MCHC: 34.8 g/dL (ref 30.0–36.0)
MCV: 89 fL (ref 80.0–100.0)
Monocytes Absolute: 0.5 K/uL (ref 0.1–1.0)
Monocytes Relative: 6 %
Neutro Abs: 6.7 K/uL (ref 1.7–7.7)
Neutrophils Relative %: 77 %
Platelets: 264 K/uL (ref 150–400)
RBC: 4.81 MIL/uL (ref 3.87–5.11)
RDW: 12.4 % (ref 11.5–15.5)
WBC: 8.7 K/uL (ref 4.0–10.5)
nRBC: 0 % (ref 0.0–0.2)

## 2024-04-22 LAB — URINALYSIS, ROUTINE W REFLEX MICROSCOPIC
Bilirubin Urine: NEGATIVE
Glucose, UA: NEGATIVE mg/dL
Ketones, ur: NEGATIVE mg/dL
Nitrite: NEGATIVE
Protein, ur: NEGATIVE mg/dL
Specific Gravity, Urine: 1.011 (ref 1.005–1.030)
pH: 6 (ref 5.0–8.0)

## 2024-04-22 LAB — COMPREHENSIVE METABOLIC PANEL WITH GFR
ALT: 20 U/L (ref 0–44)
AST: 20 U/L (ref 15–41)
Albumin: 4.2 g/dL (ref 3.5–5.0)
Alkaline Phosphatase: 69 U/L (ref 38–126)
Anion gap: 12 (ref 5–15)
BUN: 13 mg/dL (ref 6–20)
CO2: 25 mmol/L (ref 22–32)
Calcium: 9.5 mg/dL (ref 8.9–10.3)
Chloride: 104 mmol/L (ref 98–111)
Creatinine, Ser: 0.74 mg/dL (ref 0.44–1.00)
GFR, Estimated: >60 mL/min
Glucose, Bld: 142 mg/dL — ABNORMAL HIGH (ref 70–99)
Potassium: 3.7 mmol/L (ref 3.5–5.1)
Sodium: 140 mmol/L (ref 135–145)
Total Bilirubin: 0.6 mg/dL (ref 0.0–1.2)
Total Protein: 7.2 g/dL (ref 6.5–8.1)

## 2024-04-22 LAB — LIPASE, BLOOD: Lipase: 31 U/L (ref 11–51)

## 2024-04-22 MED ORDER — IOHEXOL 350 MG/ML SOLN
75.0000 mL | Freq: Once | INTRAVENOUS | Status: AC | PRN
  Administered 2024-04-22: 75 mL via INTRAVENOUS

## 2024-04-22 MED ORDER — MORPHINE SULFATE (PF) 4 MG/ML IV SOLN
4.0000 mg | Freq: Once | INTRAVENOUS | Status: AC
  Administered 2024-04-22: 4 mg via INTRAVENOUS
  Filled 2024-04-22: qty 1

## 2024-04-22 MED ORDER — OMEPRAZOLE 20 MG PO CPDR: 20.0000 mg | DELAYED_RELEASE_CAPSULE | Freq: Two times a day (BID) | ORAL | 0 refills | Status: AC

## 2024-04-22 NOTE — Discharge Instructions (Signed)
 Como comentamos, sus anlisis y radiografas son tranquilizadores y no muestran ningn infarto ni infeccin. Puede irse a casa. Se recomienda que tome Prilosec dos veces al da para ver si esto el paso corporation sntomas de ardor. Consulte con su mdico en una semana para una revisin. Si los sntomas persisten, su mdico podra derivarlo a futures trader. Regrese a la sala de emergencias si presenta fiebre alta, sangre en las heces o el vmito, dolor intenso o cualquier otro sntoma preocupante.  As we discussed, your tests and xrays are reassuring and do not show any heart attack or infection. You can be discharged home. It is recommended that you take Prilosec twice daily to see if this helps with symptoms of burning type pain. Follow up with your doctor in one week for recheck. If symptoms persist, your doctor may refer you to a stomach doctor. REturn to the ED if you develop a high fever, see blood in your stools or vomit, have severe pain or new concern.

## 2024-04-22 NOTE — ED Provider Notes (Addendum)
 " [facility]    CSN: [id number] Arrival date & time: 04/22/24  1022      History   Chief Complaint No chief complaint on file.   HPI Sydney Lopez is a 53 y.o. female.   This 53 year old female is being seen for complaints of periumbilical and left-sided abdominal pain described as stabbing and burning that radiates through to her back, and up to her head and left side of her neck.  She reports symptom onset was yesterday at 10 AM.  She says it lasted until around 2 PM and then resolved for a while and then returned.  She had 2 episodes of emesis yesterday, denies emesis today.  She reports dizziness, headache.  She reports shortness of breath when the pain is most severe.  She applied heat packs to her abdomen and back yesterday with no relief of symptoms.  She reports history of acid reflux, but states this is much worse.  She denies alcohol use.  She denies nasal congestion, sore throat.  She denies chest pain.  She denies diarrhea.  She denies urinary symptoms including dysuria, urgency, frequency.  The history is provided by the patient. The history is limited by a language barrier. A language interpreter was used [phone]).    Past Medical History:  Diagnosis Date   Asthma     Patient Active Problem List   Diagnosis Date Noted   Screening breast examination 04/18/2019    Past Surgical History:  Procedure Laterality Date   CHOLECYSTECTOMY     SHOULDER SURGERY      OB History     Gravida  5   Para  4   Term  4   Preterm      AB  1   Living  4      SAB  1   IAB      Ectopic      Multiple      Live Births               Home Medications    Prior to Admission medications  Medication Sig Start Date End Date Taking? Authorizing Provider  albuterol  (VENTOLIN  HFA) 108 (90 Base) MCG/ACT inhaler Inhale 2 puffs into the lungs every 6 (six) hours as needed for wheezing or shortness of breath. 04/10/22  Yes Wedderburn, Ngozi N, NP   acetaminophen  (TYLENOL ) 325 MG tablet Take 650 mg by mouth every 6 (six) hours as needed.    [provider]  albuterol  (PROVENTIL ) (2.5 MG/3ML) 0.083% nebulizer solution Take 3 mLs (2.5 mg total) by nebulization every 4 (four) hours as needed for wheezing or shortness of breath. 08/18/22   Vonna Sharlet POUR, MD    Family History Family History  Problem Relation Age of Onset   Diabetes Brother    Breast cancer Neg Hx     Social History Social History[1]   Allergies   Ibuprofen    Review of Systems Review of Systems  Constitutional:  Positive for activity change. Negative for appetite change, chills and fever.  HENT:  Negative for congestion and sore throat.   Respiratory:  Positive for shortness of breath. Negative for cough.   Cardiovascular:  Negative for chest pain and palpitations.  Gastrointestinal:  Positive for abdominal pain, nausea and vomiting. Negative for constipation and diarrhea.  Genitourinary:  Negative for difficulty urinating, dysuria, frequency and urgency.  Musculoskeletal:  Positive for back pain.  Skin:  Negative for color change and rash.  Neurological:  Positive for  dizziness and headaches.  All other systems reviewed and are negative.    Physical Exam Triage Vital Signs ED Triage Vitals  Encounter Vitals Group     BP      Girls Systolic BP Percentile      Girls Diastolic BP Percentile      Boys Systolic BP Percentile      Boys Diastolic BP Percentile      Pulse      Resp      Temp      Temp src      SpO2      Weight      Height      Head Circumference      Peak Flow      Pain Score      Pain Loc      Pain Education      Exclude from Growth Chart    No data found.  Updated Vital Signs BP 125/75 (BP Location: Right Arm)   Pulse 75   Temp 98.9 F (37.2 C) (Oral)   Resp 18   LMP 04/05/2016   SpO2 98%   Visual Acuity Right Eye Distance:   Left Eye Distance:   Bilateral Distance:    Right Eye Near:   Left Eye Near:     Bilateral Near:     Physical Exam Vitals and nursing note reviewed.  Constitutional:      General: She is not in acute distress.    Appearance: She is well-developed. She is not toxic-appearing.     Comments: Pleasant female appearing stated age found sitting in chair in no acute distress.  HENT:     Head: Normocephalic and atraumatic.     Mouth/Throat:     Lips: Pink.  Eyes:     Conjunctiva/sclera: Conjunctivae normal.  Cardiovascular:     Rate and Rhythm: Normal rate and regular rhythm.     Heart sounds: Normal heart sounds. No murmur heard. Pulmonary:     Effort: Pulmonary effort is normal. No respiratory distress.     Breath sounds: Normal breath sounds.  Abdominal:     General: Bowel sounds are normal.     Palpations: Abdomen is soft.     Tenderness: There is abdominal tenderness in the periumbilical area and left upper quadrant. There is no guarding.  Skin:    General: Skin is warm and dry.     Capillary Refill: Capillary refill takes less than 2 seconds.     Findings: No rash.  Neurological:     Mental Status: She is alert.  Psychiatric:        Mood and Affect: Mood normal.      UC Treatments / Results  Labs (all labs ordered are listed, but only abnormal results are displayed) Labs Reviewed - No data to display  EKG   Radiology No results found.  Procedures Procedures (including critical care time)  Medications Ordered in UC Medications - No data to display  Initial Impression / Assessment and Plan / UC Course  I have reviewed the triage vital signs and the nursing notes.  Pertinent labs & imaging results that were available during my care of the patient were reviewed by me and considered in my medical decision making (see chart for details).     Vitals and triage reviewed, patient is hemodynamically stable at this time.  Due to presenting complaints and symptoms, patient is advised to go to emergency department for further evaluation and  treatment.  She  is agreeable to this plan.  She is stable for private transport.  Her friend will transport her to emergency department now. Final Clinical Impressions(s) / UC Diagnoses   Final diagnoses:  Abdominal pain of multiple sites     Discharge Instructions      Requires higher level of care with resources not available to urgent care.     ED Prescriptions   None    PDMP not reviewed this encounter.    Lennice Jon BROCKS, FNP 04/22/24 1418     [1]  Social History Tobacco Use   Smoking status: Never   Smokeless tobacco: Never  Vaping Use   Vaping status: Never Used  Substance Use Topics   Alcohol use: No   Drug use: No     Lennice Jon BROCKS, FNP 04/22/24 1420  "

## 2024-04-22 NOTE — Discharge Instructions (Addendum)
 Requires higher level of care with resources not available to urgent care.

## 2024-04-22 NOTE — ED Triage Notes (Signed)
 Pt c.o abd pain, chest pain and emesis x 2 days, ednies fever. Pt sent here from UC for further eval.

## 2024-04-22 NOTE — ED Notes (Signed)
 Patient is being discharged from the Urgent Care and sent to the Emergency Department via POV . Per Jon Nimrod, NP, patient is in need of higher level of care due to limited resources. Patient is aware and verbalizes understanding of plan of care.  Vitals:   04/22/24 1345  BP: 125/75  Pulse: 75  Resp: 18  Temp: 98.9 F (37.2 C)  SpO2: 98%

## 2024-04-22 NOTE — ED Provider Notes (Signed)
 "  EMERGENCY DEPARTMENT AT [facility] Provider Note   CSN: [id number] Arrival date & time: 04/22/24  1455     Patient presents with: Abdominal Pain, Chest Pain, and Emesis   Sydney Lopez is a 53 y.o. female.   Patient to ED from Urgent Care for evaluation of symptoms that started yesterday morning described as burning in the epigastrium, going into her chest, through to her back and to the neck. She has nausea with vomiting x 1 yesterday. She also reports pain in the LUQ abdomen, onset at the same time. No cough or fever. Symptoms persisted and became worse this morning prompting her to go Urgent Care. She denies SOB. No regular use of NSAIDs, no alcohol use.   The history is provided by the patient. No language interpreter was used.  Abdominal Pain Associated symptoms: chest pain and vomiting   Chest Pain Associated symptoms: abdominal pain and vomiting   Emesis Associated symptoms: abdominal pain        Prior to Admission medications  Medication Sig Start Date End Date Taking? Authorizing Provider  omeprazole  (PRILOSEC) 20 MG capsule Take 1 capsule (20 mg total) by mouth 2 (two) times daily before a meal. 04/22/24  Yes Veronika Heard, PA-C  acetaminophen  (TYLENOL ) 325 MG tablet Take 650 mg by mouth every 6 (six) hours as needed.    [provider]  albuterol  (PROVENTIL ) (2.5 MG/3ML) 0.083% nebulizer solution Take 3 mLs (2.5 mg total) by nebulization every 4 (four) hours as needed for wheezing or shortness of breath. 08/18/22   Vonna Sharlet POUR, MD  albuterol  (VENTOLIN  HFA) 108 (90 Base) MCG/ACT inhaler Inhale 2 puffs into the lungs every 6 (six) hours as needed for wheezing or shortness of breath. 04/10/22   Wedderburn, Ngozi N, NP    Allergies: Ibuprofen     Review of Systems  Cardiovascular:  Positive for chest pain.  Gastrointestinal:  Positive for abdominal pain and vomiting.    Updated Vital Signs BP 133/88 (BP Location: Left Arm)    Pulse 71   Temp 98.4 F (36.9 C) (Oral)   Resp 16   LMP 04/05/2016   SpO2 100%   Physical Exam Constitutional:      Appearance: She is well-developed. She is obese.  HENT:     Head: Normocephalic.  Cardiovascular:     Rate and Rhythm: Normal rate and regular rhythm.     Heart sounds: No murmur heard. Pulmonary:     Effort: Pulmonary effort is normal.     Breath sounds: Normal breath sounds. No wheezing, rhonchi or rales.  Abdominal:     General: There is no distension.     Palpations: Abdomen is soft.     Tenderness: There is abdominal tenderness in the left lower quadrant. There is left CVA tenderness. There is no right CVA tenderness, guarding or rebound.  Musculoskeletal:        General: Normal range of motion.     Cervical back: Normal range of motion and neck supple.     Right lower leg: No edema.     Left lower leg: No edema.  Skin:    General: Skin is warm and dry.  Neurological:     General: No focal deficit present.     Mental Status: She is alert and oriented to person, place, and time.     (all labs ordered are listed, but only abnormal results are displayed) Labs Reviewed  COMPREHENSIVE METABOLIC PANEL WITH GFR - Abnormal; Notable for  the following components:      Result Value   Glucose, Bld 142 (*)    All other components within normal limits  URINALYSIS, ROUTINE W REFLEX MICROSCOPIC - Abnormal; Notable for the following components:   APPearance HAZY (*)    Hgb urine dipstick SMALL (*)    Leukocytes,Ua SMALL (*)    Bacteria, UA RARE (*)    All other components within normal limits  LIPASE, BLOOD  CBC WITH DIFFERENTIAL/PLATELET  TROPONIN T, HIGH SENSITIVITY   Results for orders placed or performed during the hospital encounter of 04/22/24  Comprehensive metabolic panel   Collection Time: 04/22/24  3:48 PM  Result Value Ref Range   Sodium 140 135 - 145 mmol/L   Potassium 3.7 3.5 - 5.1 mmol/L   Chloride 104 98 - 111 mmol/L   CO2 25 22 - 32 mmol/L    Glucose, Bld 142 (H) 70 - 99 mg/dL   BUN 13 6 - 20 mg/dL   Creatinine, Ser 9.25 0.44 - 1.00 mg/dL   Calcium  9.5 8.9 - 10.3 mg/dL   Total Protein 7.2 6.5 - 8.1 g/dL   Albumin 4.2 3.5 - 5.0 g/dL   AST 20 15 - 41 U/L   ALT 20 0 - 44 U/L   Alkaline Phosphatase 69 38 - 126 U/L   Total Bilirubin 0.6 0.0 - 1.2 mg/dL   GFR, Estimated >39 >39 mL/min   Anion gap 12 5 - 15  Lipase, blood   Collection Time: 04/22/24  3:48 PM  Result Value Ref Range   Lipase 31 11 - 51 U/L  CBC with Diff   Collection Time: 04/22/24  3:48 PM  Result Value Ref Range   WBC 8.7 4.0 - 10.5 K/uL   RBC 4.81 3.87 - 5.11 MIL/uL   Hemoglobin 14.9 12.0 - 15.0 g/dL   HCT 57.1 63.9 - 53.9 %   MCV 89.0 80.0 - 100.0 fL   MCH 31.0 26.0 - 34.0 pg   MCHC 34.8 30.0 - 36.0 g/dL   RDW 87.5 88.4 - 84.4 %   Platelets 264 150 - 400 K/uL   nRBC 0.0 0.0 - 0.2 %   Neutrophils Relative % 77 %   Neutro Abs 6.7 1.7 - 7.7 K/uL   Lymphocytes Relative 16 %   Lymphs Abs 1.4 0.7 - 4.0 K/uL   Monocytes Relative 6 %   Monocytes Absolute 0.5 0.1 - 1.0 K/uL   Eosinophils Relative 1 %   Eosinophils Absolute 0.1 0.0 - 0.5 K/uL   Basophils Relative 0 %   Basophils Absolute 0.0 0.0 - 0.1 K/uL   Immature Granulocytes 0 %   Abs Immature Granulocytes 0.03 0.00 - 0.07 K/uL  Urinalysis, Routine w reflex microscopic -Urine, Clean Catch   Collection Time: 04/22/24  3:48 PM  Result Value Ref Range   Color, Urine YELLOW YELLOW   APPearance HAZY (A) CLEAR   Specific Gravity, Urine 1.011 1.005 - 1.030   pH 6.0 5.0 - 8.0   Glucose, UA NEGATIVE NEGATIVE mg/dL   Hgb urine dipstick SMALL (A) NEGATIVE   Bilirubin Urine NEGATIVE NEGATIVE   Ketones, ur NEGATIVE NEGATIVE mg/dL   Protein, ur NEGATIVE NEGATIVE mg/dL   Nitrite NEGATIVE NEGATIVE   Leukocytes,Ua SMALL (A) NEGATIVE   RBC / HPF 0-5 0 - 5 RBC/hpf   WBC, UA 6-10 0 - 5 WBC/hpf   Bacteria, UA RARE (A) NONE SEEN   Squamous Epithelial / HPF 6-10 0 - 5 /HPF   Mucus  PRESENT   Troponin T, High  Sensitivity   Collection Time: 04/22/24  3:48 PM  Result Value Ref Range   Troponin T High Sensitivity <15 0 - 19 ng/L    EKG: None  Radiology: CT ABDOMEN PELVIS W CONTRAST Result Date: 04/22/2024 CLINICAL DATA:  Left-sided abdominal pain with nausea and vomiting. EXAM: CT ABDOMEN AND PELVIS WITH CONTRAST TECHNIQUE: Multidetector CT imaging of the abdomen and pelvis was performed using the standard protocol following bolus administration of intravenous contrast. RADIATION DOSE REDUCTION: This exam was performed according to the departmental dose-optimization program which includes automated exposure control, adjustment of the mA and/or kV according to patient size and/or use of iterative reconstruction technique. CONTRAST:  75mL OMNIPAQUE  IOHEXOL  350 MG/ML SOLN COMPARISON:  None Available. FINDINGS: Lower chest: No acute abnormality. Hepatobiliary: There is diffuse fatty infiltration of the liver parenchyma. No focal liver abnormality is seen. Status post cholecystectomy. No biliary dilatation. Pancreas: Unremarkable. No pancreatic ductal dilatation or surrounding inflammatory changes. Spleen: Normal in size without focal abnormality. Adrenals/Urinary Tract: Adrenal glands are unremarkable. Kidneys are normal, without renal calculi, focal lesion, or hydronephrosis. Bladder is unremarkable. Stomach/Bowel: Stomach is within normal limits. Appendix appears normal. No evidence of bowel wall thickening, distention, or inflammatory changes. Vascular/Lymphatic: No significant vascular findings are present. No enlarged abdominal or pelvic lymph nodes. Reproductive: Uterus and bilateral adnexa are unremarkable. Other: No abdominal wall hernia or abnormality. No abdominopelvic ascites. Musculoskeletal: No acute or significant osseous findings. IMPRESSION: 1. Hepatic steatosis. 2. Evidence of prior cholecystectomy. Electronically Signed   By: Suzen Dials M.D.   On: 04/22/2024 22:37   DG Chest 2 View Result  Date: 04/22/2024 EXAM: 2 VIEW(S) XRAY OF THE CHEST 04/22/2024 04:27:00 PM COMPARISON: Chest x-ray dated 04/13/2022. CLINICAL HISTORY: L sided CP. FINDINGS: LUNGS AND PLEURA: No focal pulmonary opacity. No pleural effusion. No pneumothorax. HEART AND MEDIASTINUM: No acute abnormality of the cardiac and mediastinal silhouettes. BONES AND SOFT TISSUES: No acute osseous abnormality. IMPRESSION: 1. No acute process. Electronically signed by: Greig Pique MD 04/22/2024 05:29 PM EST RP Workstation: [id number]     Procedures   Medications Ordered in the ED  morphine  (PF) 4 MG/ML injection 4 mg (4 mg Intravenous Given 04/22/24 2155)  iohexol  (OMNIPAQUE ) 350 MG/ML injection 75 mL (75 mLs Intravenous Contrast Given 04/22/24 2147)    Clinical Course as of 04/22/24 2331  Sat Apr 22, 2024  2235 Patient to ED with ss/sxs as per HPI. Still having pain when seen in ED by me. Labs reviewed and do not determine the cause of her symptoms, however, not consistent with MI/ACS, PNA, infection. She is quite tender in the LUQ. CT abd/pel pending. Pain addressed.  [SU]  2253  IMPRESSION: 1. Hepatic steatosis. 2. Evidence of prior cholecystectomy.   She is much more comfortable. VSS. Will start prilosec for symptoms of reflux. Recommend close recheck with PCP regarding the LUQ pain.  [SU]    Clinical Course User Index [SU] Odell Balls, PA-C                                 Medical Decision Making Amount and/or Complexity of Data Reviewed Radiology: ordered.  Risk Prescription drug management.        Final diagnoses:  Pain of upper abdomen    ED Discharge Orders          Ordered    omeprazole  (PRILOSEC) 20 MG capsule  2 times  daily before meals        04/22/24 2323               Odell Balls, PA-C 04/22/24 2331  "

## 2024-04-22 NOTE — ED Provider Triage Note (Signed)
 Emergency Medicine Provider Triage Evaluation Note  Sydney Lopez , a 53 y.o. female  was evaluated in triage.  Pt complains of left-sided abdominal pain/nausea/vomiting.  Symptoms began 2 days ago, notes sharp discomfort on the left side of her abdomen that has been associated with nausea/vomiting with radiation to her left chest and head.  Was seen at urgent care and was told these could be signs of a heart attack, and was instructed to come to the emergency department for further evaluation.  Reports feeling hot in my face at times but no known fevers at home.  Review of Systems  Positive: As above Negative: As above  Physical Exam  BP (!) 140/95   Pulse 89   Temp 97.6 F (36.4 C)   Resp 16   LMP 04/05/2016   SpO2 100%  Gen:   Awake, no distress   Resp:  Normal effort  MSK:   Moves extremities without difficulty  Other:  Abdomen is soft and nontender on exam  Medical Decision Making  Medically screening exam initiated at 3:48 PM.  Appropriate orders placed.  Naphtali Bello-Reyes was informed that the remainder of the evaluation will be completed by another provider, this initial triage assessment does not replace that evaluation, and the importance of remaining in the ED until their evaluation is complete.     Glendia Rocky SAILOR, [place] 04/22/24 1549

## 2024-04-22 NOTE — ED Triage Notes (Signed)
 Yesterday has head pain, ear pain, generalized hot and felt like something went from right side of  abdomen to her head.  symptoms happen for a half an hour and then stops.    Has placed warm packs to stomach and back.    Patient reports vomiting 2 times yesterday and none today.    Denies any diarrhea

## 2024-04-23 ENCOUNTER — Emergency Department (HOSPITAL_COMMUNITY): Admission: EM | Admit: 2024-04-23 | Discharge: 2024-04-23 | Discharge status: Against Medical Advice | Payer: Self-pay

## 2024-04-23 ENCOUNTER — Encounter (HOSPITAL_COMMUNITY): Payer: Self-pay

## 2024-04-23 DIAGNOSIS — R519 Headache, unspecified: Secondary | ICD-10-CM | POA: Insufficient documentation

## 2024-04-23 DIAGNOSIS — Z5321 Procedure and treatment not carried out due to patient leaving prior to being seen by health care provider: Secondary | ICD-10-CM | POA: Insufficient documentation

## 2024-04-23 DIAGNOSIS — R109 Unspecified abdominal pain: Secondary | ICD-10-CM | POA: Insufficient documentation

## 2024-04-23 LAB — CBC WITH DIFFERENTIAL/PLATELET
Abs Immature Granulocytes: 0.03 K/uL (ref 0.00–0.07)
Basophils Absolute: 0.1 K/uL (ref 0.0–0.1)
Basophils Relative: 1 %
Eosinophils Absolute: 0.1 K/uL (ref 0.0–0.5)
Eosinophils Relative: 1 %
HCT: 44.4 % (ref 36.0–46.0)
Hemoglobin: 14.9 g/dL (ref 12.0–15.0)
Immature Granulocytes: 0 %
Lymphocytes Relative: 23 %
Lymphs Abs: 1.7 K/uL (ref 0.7–4.0)
MCH: 30.7 pg (ref 26.0–34.0)
MCHC: 33.6 g/dL (ref 30.0–36.0)
MCV: 91.4 fL (ref 80.0–100.0)
Monocytes Absolute: 0.6 K/uL (ref 0.1–1.0)
Monocytes Relative: 8 %
Neutro Abs: 4.9 K/uL (ref 1.7–7.7)
Neutrophils Relative %: 67 %
Platelets: 260 K/uL (ref 150–400)
RBC: 4.86 MIL/uL (ref 3.87–5.11)
RDW: 12.3 % (ref 11.5–15.5)
WBC: 7.4 K/uL (ref 4.0–10.5)
nRBC: 0 % (ref 0.0–0.2)

## 2024-04-23 LAB — COMPREHENSIVE METABOLIC PANEL WITH GFR
ALT: 18 U/L (ref 0–44)
AST: 25 U/L (ref 15–41)
Albumin: 4 g/dL (ref 3.5–5.0)
Alkaline Phosphatase: 72 U/L (ref 38–126)
Anion gap: 13 (ref 5–15)
BUN: 19 mg/dL (ref 6–20)
CO2: 20 mmol/L — ABNORMAL LOW (ref 22–32)
Calcium: 9 mg/dL (ref 8.9–10.3)
Chloride: 103 mmol/L (ref 98–111)
Creatinine, Ser: 0.73 mg/dL (ref 0.44–1.00)
GFR, Estimated: >60 mL/min
Glucose, Bld: 112 mg/dL — ABNORMAL HIGH (ref 70–99)
Potassium: 3.9 mmol/L (ref 3.5–5.1)
Sodium: 136 mmol/L (ref 135–145)
Total Bilirubin: 0.4 mg/dL (ref 0.0–1.2)
Total Protein: 7.1 g/dL (ref 6.5–8.1)

## 2024-04-23 LAB — LIPASE, BLOOD: Lipase: 41 U/L (ref 11–51)

## 2024-04-23 MED ORDER — ONDANSETRON 4 MG PO TBDP
4.0000 mg | ORAL_TABLET | Freq: Once | ORAL | Status: AC
  Administered 2024-04-23: 4 mg via ORAL
  Filled 2024-04-23: qty 1

## 2024-04-23 NOTE — ED Notes (Signed)
 Pt left this ED tech know that she did not want to wait any longer to be seen. Pt marked eloped.

## 2024-04-23 NOTE — ED Provider Triage Note (Signed)
 Emergency Medicine Provider Triage Evaluation Note  Sydney Lopez , a 53 y.o. female  was evaluated in triage.  Pt complains of abdominal pain, nausea.  Patient was discharged from the hospital last night after being evaluated for the same complaint, reports symptoms are the same, denies chest pain/shortness of breath.  Patient had unremarkable workup yesterday, CT imaging results notable for hepatic steatosis and evidence of prior cholecystectomy.  Patient was prescribed omeprazole , which she has not yet taken.  Review of Systems  Positive: As above Negative: As above  Physical Exam  BP (!) 158/90 (BP Location: Right Arm)   Pulse 79   Temp 98.6 F (37 C) (Oral)   Resp 18   Wt 76.2 kg   LMP 04/05/2016   SpO2 99%   BMI 31.74 kg/m  Gen:   Awake, no distress   Resp:  Normal effort  MSK:   Moves extremities without difficulty  Other:  Abdomen is soft, mildly tender in epigastric region  Medical Decision Making  Medically screening exam initiated at 5:40 PM.  Appropriate orders placed.  Sydney Lopez was informed that the remainder of the evaluation will be completed by another provider, this initial triage assessment does not replace that evaluation, and the importance of remaining in the ED until their evaluation is complete.     Sydney Lopez, [place] 04/23/24 [phone]

## 2024-04-23 NOTE — ED Triage Notes (Signed)
 [place]alle[place] spanish interpretor used to assist with triage. Pt accompanied with daughter. Pt reports abdominal pain, HA  X 3 days. Pt was evaluated in the ED yesterday for the same symptoms. Pt has not taken discharge medications from yesterday. Pt reports no change since yesterday. Denies CP, SHOB
